# Patient Record
Sex: Female | Born: 1935 | Race: White | Hispanic: No | Smoking: Never smoker
Health system: Southern US, Community
[De-identification: ages and names within clinical notes are randomized; demographics above are authoritative.]

## PROBLEM LIST (undated history)

## (undated) DIAGNOSIS — M1990 Unspecified osteoarthritis, unspecified site: Secondary | ICD-10-CM

## (undated) DIAGNOSIS — I10 Essential (primary) hypertension: Secondary | ICD-10-CM

## (undated) DIAGNOSIS — E039 Hypothyroidism, unspecified: Secondary | ICD-10-CM

## (undated) DIAGNOSIS — C50919 Malignant neoplasm of unspecified site of unspecified female breast: Secondary | ICD-10-CM

## (undated) HISTORY — PX: ABDOMINAL HYSTERECTOMY: SHX81

## (undated) HISTORY — PX: BREAST SURGERY: SHX581

## (undated) HISTORY — PX: KNEE ARTHROPLASTY: SHX992

## (undated) HISTORY — PX: REPLACEMENT TOTAL KNEE: SUR1224

## (undated) HISTORY — PX: RECTOCELE REPAIR: SHX761

## (undated) HISTORY — DX: Essential (primary) hypertension: I10

## (undated) HISTORY — DX: Malignant neoplasm of unspecified site of unspecified female breast: C50.919

## (undated) HISTORY — PX: CHOLECYSTECTOMY: SHX55

## (undated) HISTORY — PX: CYSTOCELE REPAIR: SHX163

## (undated) HISTORY — PX: TUBAL LIGATION: SHX77

## (undated) HISTORY — DX: Hypothyroidism, unspecified: E03.9

## (undated) HISTORY — DX: Unspecified osteoarthritis, unspecified site: M19.90

---

## 2004-10-23 ENCOUNTER — Encounter: Admission: RE | Admit: 2004-10-23 | Discharge: 2004-10-23 | Payer: Self-pay | Admitting: Internal Medicine

## 2005-01-30 ENCOUNTER — Encounter: Admission: RE | Admit: 2005-01-30 | Discharge: 2005-01-30 | Payer: Self-pay | Admitting: Internal Medicine

## 2005-02-07 ENCOUNTER — Encounter: Admission: RE | Admit: 2005-02-07 | Discharge: 2005-02-07 | Payer: Self-pay | Admitting: Internal Medicine

## 2005-02-28 ENCOUNTER — Encounter: Admission: RE | Admit: 2005-02-28 | Discharge: 2005-02-28 | Payer: Self-pay | Admitting: Internal Medicine

## 2005-07-30 ENCOUNTER — Encounter: Admission: RE | Admit: 2005-07-30 | Discharge: 2005-07-30 | Payer: Self-pay | Admitting: Internal Medicine

## 2006-06-11 IMAGING — US US MFM FOLLOW-UP FOCUS VISIT
1 series · 13 of 23 positions shown · non-contrast
Comparison: none

CLINICAL DATA: one month follow-up visit after combination sclerotherapy and transcatheter laser occlusion of the left GSV performed on 01/30/05.
ULTRASOUND ESTABLISHED PATIENT OFFICE VISIT ? LEVEL II ? [id number]:
Today, Ms. Witmer returns for a one month follow-up visit after combination sclerotherapy and transcatheter laser occlusion of the left GSV performed on 01/30/05.
Since treatment, she has recovered well over the last month.  She has improvement in the mild thrombophlebitis along the treated segment.  She tolerates wearing the graded compression stockings and continues a regular exercise program.  
On exam, the bruising along the treated segment has resolved.  There is obvious palpable thrombus in the superficial varicosities which are nontender.  The entry sites have healed.  Overlying skin intact.  No erythema, swelling or fluctuance.
Ultrasound was performed today demonstrating complete occlusion of a treated GSV segment.  The deep venous system is normal.

[Series 1: unknown · 13 of 23 slices shown]
[im 1/23]
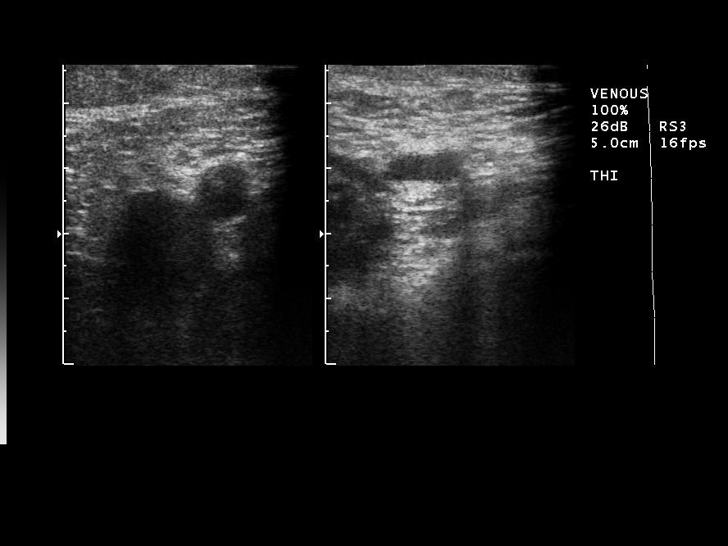
[im 3/23]
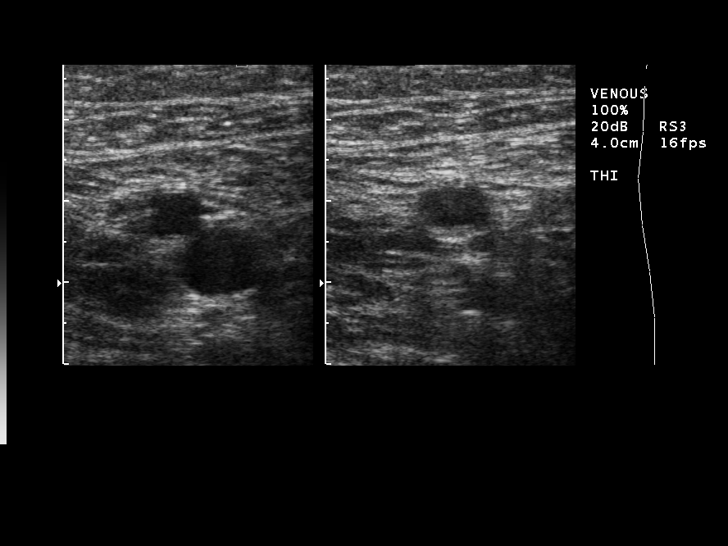
[im 5/23]
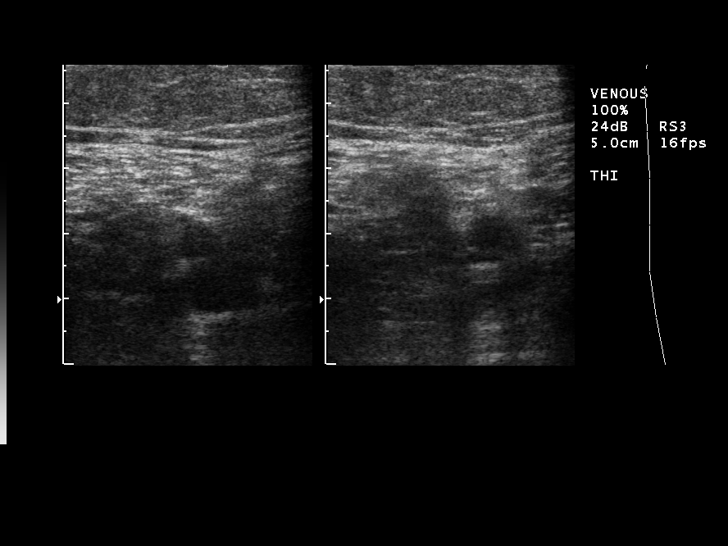
[im 7/23]
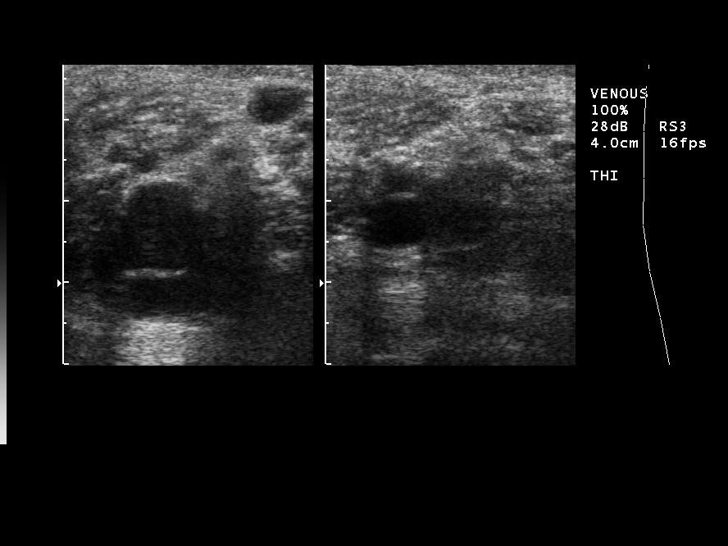
[im 8/23]
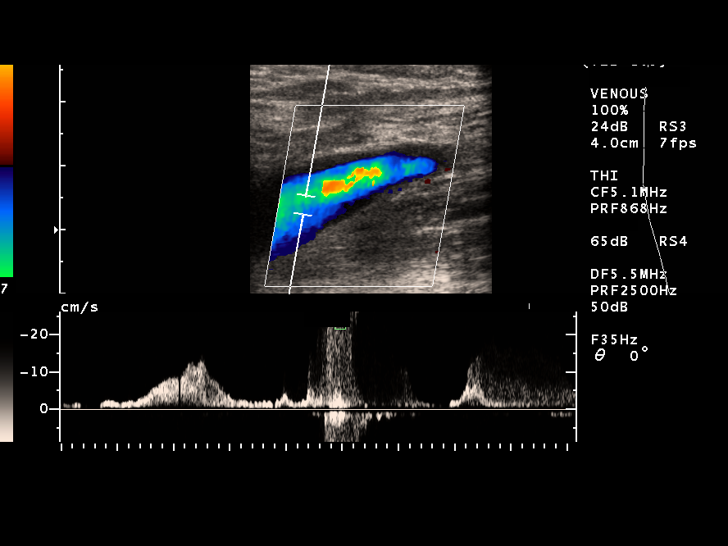
[im 10/23]
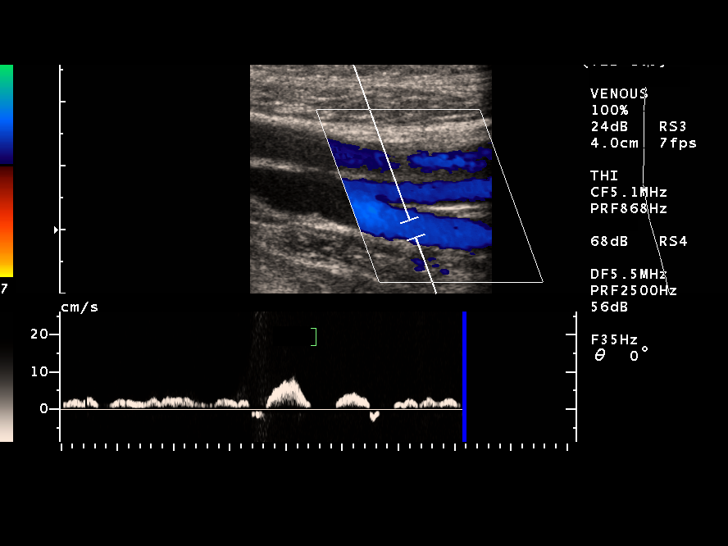
[im 12/23]
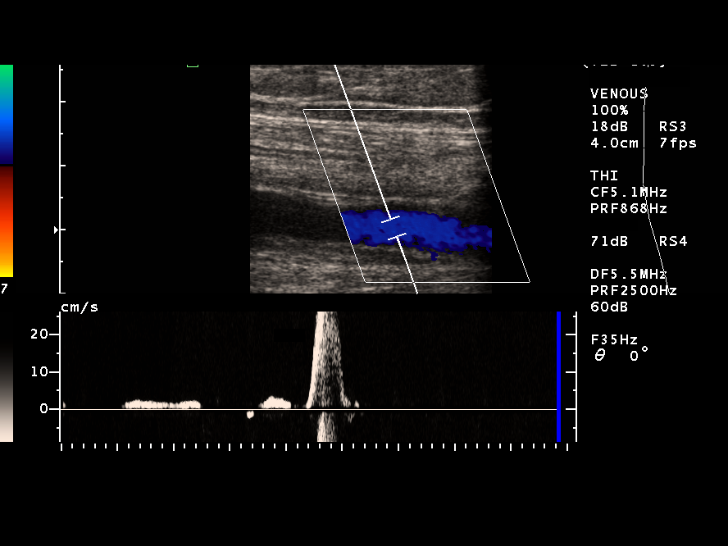
[im 14/23]
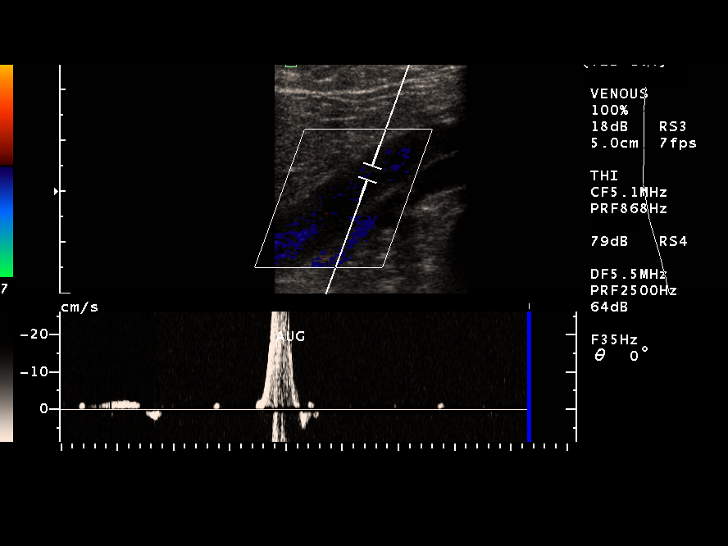
[im 16/23]
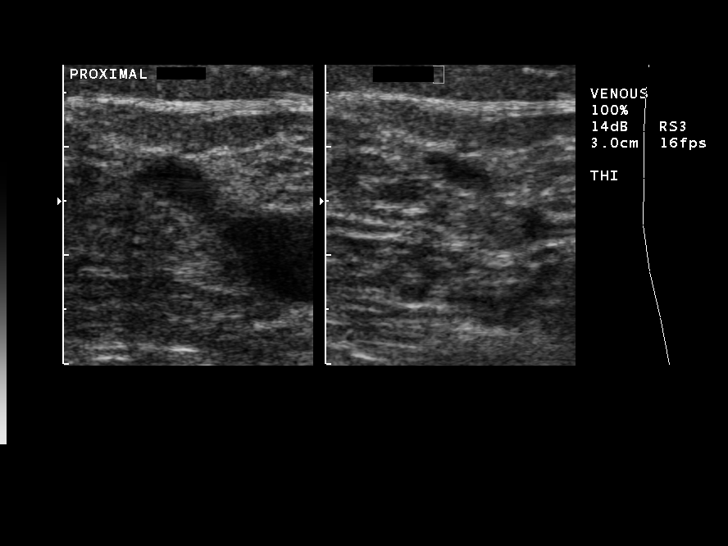
[im 17/23]
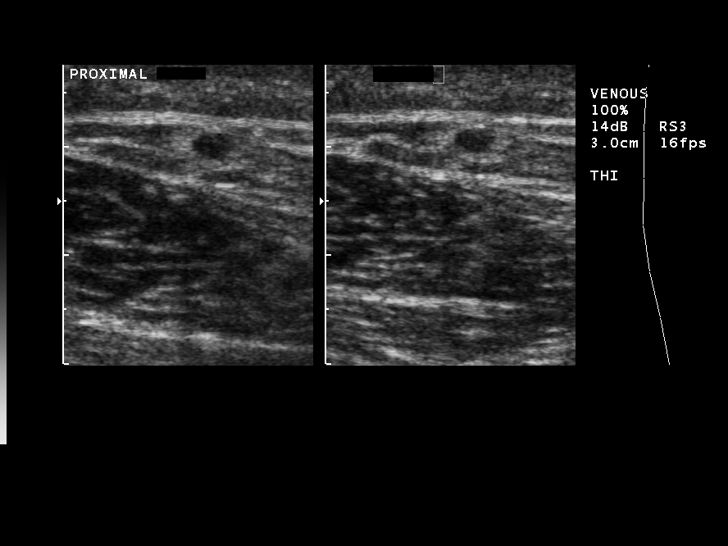
[im 19/23]
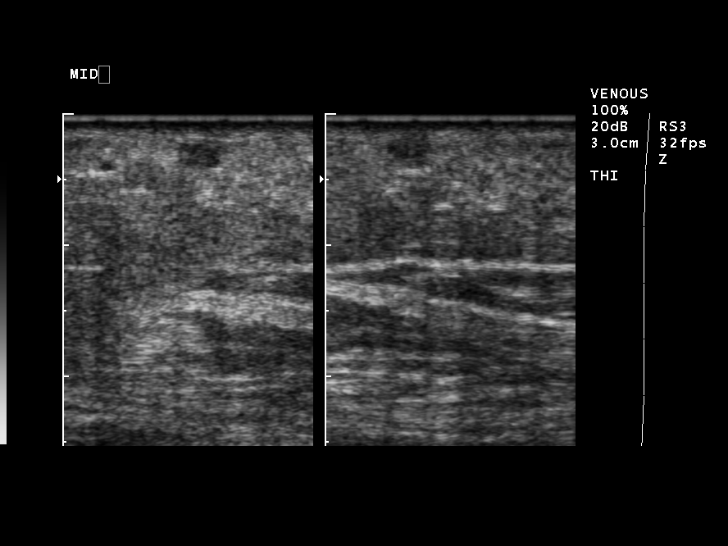
[im 21/23]
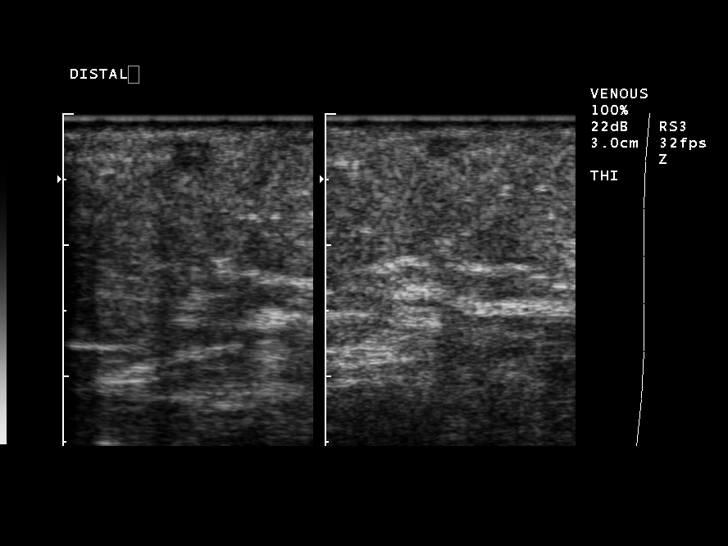
[im 23/23]
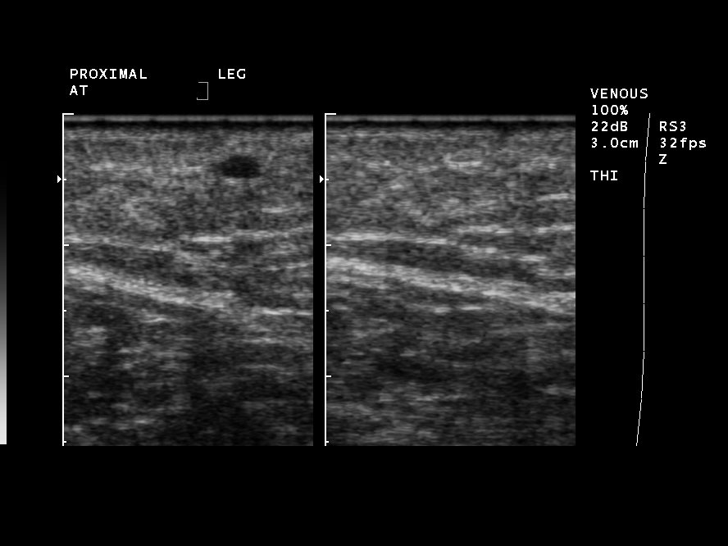

[13 of 23 positions shown; findings below may reference images not displayed]

IMPRESSION: One month status post combination sclerotherapy and transcatheter laser occlusion of the left GSV.   She continues to do very well and was encouraged to continue wearing the graded compression stockings and her exercise regimen as much as possible.  The thrombosed superficial varicosities will resorb slowly over time.  She is scheduled for six month follow-up visit.

## 2011-03-25 DIAGNOSIS — E785 Hyperlipidemia, unspecified: Secondary | ICD-10-CM | POA: Diagnosis not present

## 2011-04-03 DIAGNOSIS — L578 Other skin changes due to chronic exposure to nonionizing radiation: Secondary | ICD-10-CM | POA: Diagnosis not present

## 2011-04-03 DIAGNOSIS — L570 Actinic keratosis: Secondary | ICD-10-CM | POA: Diagnosis not present

## 2011-04-03 DIAGNOSIS — L821 Other seborrheic keratosis: Secondary | ICD-10-CM | POA: Diagnosis not present

## 2011-04-26 DIAGNOSIS — Z1231 Encounter for screening mammogram for malignant neoplasm of breast: Secondary | ICD-10-CM | POA: Diagnosis not present

## 2011-05-17 DIAGNOSIS — H2589 Other age-related cataract: Secondary | ICD-10-CM | POA: Diagnosis not present

## 2011-07-03 DIAGNOSIS — E785 Hyperlipidemia, unspecified: Secondary | ICD-10-CM | POA: Diagnosis not present

## 2011-07-03 DIAGNOSIS — E039 Hypothyroidism, unspecified: Secondary | ICD-10-CM | POA: Diagnosis not present

## 2011-07-03 DIAGNOSIS — I10 Essential (primary) hypertension: Secondary | ICD-10-CM | POA: Diagnosis not present

## 2011-07-03 DIAGNOSIS — M899 Disorder of bone, unspecified: Secondary | ICD-10-CM | POA: Diagnosis not present

## 2011-07-03 DIAGNOSIS — M949 Disorder of cartilage, unspecified: Secondary | ICD-10-CM | POA: Diagnosis not present

## 2011-08-14 DIAGNOSIS — M810 Age-related osteoporosis without current pathological fracture: Secondary | ICD-10-CM | POA: Diagnosis not present

## 2011-08-27 DIAGNOSIS — C44529 Squamous cell carcinoma of skin of other part of trunk: Secondary | ICD-10-CM | POA: Diagnosis not present

## 2011-08-27 DIAGNOSIS — L821 Other seborrheic keratosis: Secondary | ICD-10-CM | POA: Diagnosis not present

## 2011-08-27 DIAGNOSIS — L570 Actinic keratosis: Secondary | ICD-10-CM | POA: Diagnosis not present

## 2011-09-10 DIAGNOSIS — C44529 Squamous cell carcinoma of skin of other part of trunk: Secondary | ICD-10-CM | POA: Diagnosis not present

## 2011-09-26 DIAGNOSIS — M1710 Unilateral primary osteoarthritis, unspecified knee: Secondary | ICD-10-CM | POA: Diagnosis not present

## 2011-09-26 DIAGNOSIS — M19049 Primary osteoarthritis, unspecified hand: Secondary | ICD-10-CM | POA: Diagnosis not present

## 2011-10-03 DIAGNOSIS — M899 Disorder of bone, unspecified: Secondary | ICD-10-CM | POA: Diagnosis not present

## 2011-10-03 DIAGNOSIS — I10 Essential (primary) hypertension: Secondary | ICD-10-CM | POA: Diagnosis not present

## 2011-10-17 DIAGNOSIS — M1710 Unilateral primary osteoarthritis, unspecified knee: Secondary | ICD-10-CM | POA: Diagnosis not present

## 2011-10-17 DIAGNOSIS — M25569 Pain in unspecified knee: Secondary | ICD-10-CM | POA: Diagnosis not present

## 2011-11-18 DIAGNOSIS — H2589 Other age-related cataract: Secondary | ICD-10-CM | POA: Diagnosis not present

## 2011-11-20 DIAGNOSIS — Z79899 Other long term (current) drug therapy: Secondary | ICD-10-CM | POA: Diagnosis not present

## 2011-11-20 DIAGNOSIS — R5383 Other fatigue: Secondary | ICD-10-CM | POA: Diagnosis not present

## 2011-11-20 DIAGNOSIS — M2550 Pain in unspecified joint: Secondary | ICD-10-CM | POA: Diagnosis not present

## 2011-11-20 DIAGNOSIS — M25549 Pain in joints of unspecified hand: Secondary | ICD-10-CM | POA: Diagnosis not present

## 2011-11-20 DIAGNOSIS — M25579 Pain in unspecified ankle and joints of unspecified foot: Secondary | ICD-10-CM | POA: Diagnosis not present

## 2011-11-20 DIAGNOSIS — M25569 Pain in unspecified knee: Secondary | ICD-10-CM | POA: Diagnosis not present

## 2011-11-20 DIAGNOSIS — R5381 Other malaise: Secondary | ICD-10-CM | POA: Diagnosis not present

## 2011-11-28 DIAGNOSIS — M1710 Unilateral primary osteoarthritis, unspecified knee: Secondary | ICD-10-CM | POA: Diagnosis not present

## 2011-12-04 DIAGNOSIS — Z23 Encounter for immunization: Secondary | ICD-10-CM | POA: Diagnosis not present

## 2011-12-19 DIAGNOSIS — M25569 Pain in unspecified knee: Secondary | ICD-10-CM | POA: Diagnosis not present

## 2011-12-19 DIAGNOSIS — M25549 Pain in joints of unspecified hand: Secondary | ICD-10-CM | POA: Diagnosis not present

## 2011-12-19 DIAGNOSIS — M25579 Pain in unspecified ankle and joints of unspecified foot: Secondary | ICD-10-CM | POA: Diagnosis not present

## 2011-12-30 DIAGNOSIS — L219 Seborrheic dermatitis, unspecified: Secondary | ICD-10-CM | POA: Diagnosis not present

## 2011-12-30 DIAGNOSIS — L570 Actinic keratosis: Secondary | ICD-10-CM | POA: Diagnosis not present

## 2011-12-30 DIAGNOSIS — L821 Other seborrheic keratosis: Secondary | ICD-10-CM | POA: Diagnosis not present

## 2011-12-30 DIAGNOSIS — L820 Inflamed seborrheic keratosis: Secondary | ICD-10-CM | POA: Diagnosis not present

## 2012-01-06 DIAGNOSIS — R1310 Dysphagia, unspecified: Secondary | ICD-10-CM | POA: Diagnosis not present

## 2012-01-06 DIAGNOSIS — I10 Essential (primary) hypertension: Secondary | ICD-10-CM | POA: Diagnosis not present

## 2012-01-14 DIAGNOSIS — H2589 Other age-related cataract: Secondary | ICD-10-CM | POA: Diagnosis not present

## 2012-01-21 DIAGNOSIS — H259 Unspecified age-related cataract: Secondary | ICD-10-CM | POA: Diagnosis not present

## 2012-01-21 DIAGNOSIS — H269 Unspecified cataract: Secondary | ICD-10-CM | POA: Diagnosis not present

## 2012-01-21 DIAGNOSIS — H2589 Other age-related cataract: Secondary | ICD-10-CM | POA: Diagnosis not present

## 2012-02-04 DIAGNOSIS — H259 Unspecified age-related cataract: Secondary | ICD-10-CM | POA: Diagnosis not present

## 2012-02-04 DIAGNOSIS — H269 Unspecified cataract: Secondary | ICD-10-CM | POA: Diagnosis not present

## 2012-02-04 DIAGNOSIS — H2589 Other age-related cataract: Secondary | ICD-10-CM | POA: Diagnosis not present

## 2012-03-09 DIAGNOSIS — H524 Presbyopia: Secondary | ICD-10-CM | POA: Diagnosis not present

## 2012-03-09 DIAGNOSIS — Z09 Encounter for follow-up examination after completed treatment for conditions other than malignant neoplasm: Secondary | ICD-10-CM | POA: Diagnosis not present

## 2012-03-25 DIAGNOSIS — M19049 Primary osteoarthritis, unspecified hand: Secondary | ICD-10-CM | POA: Diagnosis not present

## 2012-03-25 DIAGNOSIS — M19079 Primary osteoarthritis, unspecified ankle and foot: Secondary | ICD-10-CM | POA: Diagnosis not present

## 2012-03-25 DIAGNOSIS — M1710 Unilateral primary osteoarthritis, unspecified knee: Secondary | ICD-10-CM | POA: Diagnosis not present

## 2012-03-25 DIAGNOSIS — Z79899 Other long term (current) drug therapy: Secondary | ICD-10-CM | POA: Diagnosis not present

## 2012-03-25 DIAGNOSIS — M25549 Pain in joints of unspecified hand: Secondary | ICD-10-CM | POA: Diagnosis not present

## 2012-04-13 DIAGNOSIS — M069 Rheumatoid arthritis, unspecified: Secondary | ICD-10-CM | POA: Diagnosis not present

## 2012-04-13 DIAGNOSIS — I10 Essential (primary) hypertension: Secondary | ICD-10-CM | POA: Diagnosis not present

## 2012-04-13 DIAGNOSIS — R1310 Dysphagia, unspecified: Secondary | ICD-10-CM | POA: Diagnosis not present

## 2012-04-27 DIAGNOSIS — Z1231 Encounter for screening mammogram for malignant neoplasm of breast: Secondary | ICD-10-CM | POA: Diagnosis not present

## 2012-04-28 DIAGNOSIS — IMO0002 Reserved for concepts with insufficient information to code with codable children: Secondary | ICD-10-CM | POA: Diagnosis not present

## 2012-05-13 DIAGNOSIS — Z79899 Other long term (current) drug therapy: Secondary | ICD-10-CM | POA: Diagnosis not present

## 2012-06-29 DIAGNOSIS — L578 Other skin changes due to chronic exposure to nonionizing radiation: Secondary | ICD-10-CM | POA: Diagnosis not present

## 2012-06-29 DIAGNOSIS — L821 Other seborrheic keratosis: Secondary | ICD-10-CM | POA: Diagnosis not present

## 2012-06-29 DIAGNOSIS — L570 Actinic keratosis: Secondary | ICD-10-CM | POA: Diagnosis not present

## 2012-07-08 DIAGNOSIS — M1710 Unilateral primary osteoarthritis, unspecified knee: Secondary | ICD-10-CM | POA: Diagnosis not present

## 2012-07-08 DIAGNOSIS — M25549 Pain in joints of unspecified hand: Secondary | ICD-10-CM | POA: Diagnosis not present

## 2012-07-08 DIAGNOSIS — M810 Age-related osteoporosis without current pathological fracture: Secondary | ICD-10-CM | POA: Diagnosis not present

## 2012-07-08 DIAGNOSIS — R5383 Other fatigue: Secondary | ICD-10-CM | POA: Diagnosis not present

## 2012-07-08 DIAGNOSIS — M25519 Pain in unspecified shoulder: Secondary | ICD-10-CM | POA: Diagnosis not present

## 2012-07-13 DIAGNOSIS — J309 Allergic rhinitis, unspecified: Secondary | ICD-10-CM | POA: Diagnosis not present

## 2012-07-13 DIAGNOSIS — I10 Essential (primary) hypertension: Secondary | ICD-10-CM | POA: Diagnosis not present

## 2012-09-07 DIAGNOSIS — Z79899 Other long term (current) drug therapy: Secondary | ICD-10-CM | POA: Diagnosis not present

## 2012-09-28 DIAGNOSIS — M1710 Unilateral primary osteoarthritis, unspecified knee: Secondary | ICD-10-CM | POA: Diagnosis not present

## 2012-10-06 DIAGNOSIS — M1710 Unilateral primary osteoarthritis, unspecified knee: Secondary | ICD-10-CM | POA: Diagnosis not present

## 2012-10-14 DIAGNOSIS — E039 Hypothyroidism, unspecified: Secondary | ICD-10-CM | POA: Diagnosis not present

## 2012-10-14 DIAGNOSIS — I10 Essential (primary) hypertension: Secondary | ICD-10-CM | POA: Diagnosis not present

## 2012-10-14 DIAGNOSIS — M899 Disorder of bone, unspecified: Secondary | ICD-10-CM | POA: Diagnosis not present

## 2012-10-14 DIAGNOSIS — M129 Arthropathy, unspecified: Secondary | ICD-10-CM | POA: Diagnosis not present

## 2012-10-14 DIAGNOSIS — E785 Hyperlipidemia, unspecified: Secondary | ICD-10-CM | POA: Diagnosis not present

## 2012-10-15 DIAGNOSIS — M1710 Unilateral primary osteoarthritis, unspecified knee: Secondary | ICD-10-CM | POA: Diagnosis not present

## 2012-10-20 DIAGNOSIS — M1710 Unilateral primary osteoarthritis, unspecified knee: Secondary | ICD-10-CM | POA: Diagnosis not present

## 2012-11-04 DIAGNOSIS — M19079 Primary osteoarthritis, unspecified ankle and foot: Secondary | ICD-10-CM | POA: Diagnosis not present

## 2012-11-04 DIAGNOSIS — M19049 Primary osteoarthritis, unspecified hand: Secondary | ICD-10-CM | POA: Diagnosis not present

## 2012-11-04 DIAGNOSIS — M1710 Unilateral primary osteoarthritis, unspecified knee: Secondary | ICD-10-CM | POA: Diagnosis not present

## 2012-11-24 DIAGNOSIS — J018 Other acute sinusitis: Secondary | ICD-10-CM | POA: Diagnosis not present

## 2012-12-14 DIAGNOSIS — J309 Allergic rhinitis, unspecified: Secondary | ICD-10-CM | POA: Diagnosis not present

## 2012-12-14 DIAGNOSIS — R059 Cough, unspecified: Secondary | ICD-10-CM | POA: Diagnosis not present

## 2012-12-14 DIAGNOSIS — R05 Cough: Secondary | ICD-10-CM | POA: Diagnosis not present

## 2012-12-29 DIAGNOSIS — L821 Other seborrheic keratosis: Secondary | ICD-10-CM | POA: Diagnosis not present

## 2012-12-29 DIAGNOSIS — L820 Inflamed seborrheic keratosis: Secondary | ICD-10-CM | POA: Diagnosis not present

## 2012-12-29 DIAGNOSIS — L570 Actinic keratosis: Secondary | ICD-10-CM | POA: Diagnosis not present

## 2013-01-08 DIAGNOSIS — Z23 Encounter for immunization: Secondary | ICD-10-CM | POA: Diagnosis not present

## 2013-01-15 DIAGNOSIS — I10 Essential (primary) hypertension: Secondary | ICD-10-CM | POA: Diagnosis not present

## 2013-03-31 DIAGNOSIS — Z79899 Other long term (current) drug therapy: Secondary | ICD-10-CM | POA: Diagnosis not present

## 2013-04-06 DIAGNOSIS — Z09 Encounter for follow-up examination after completed treatment for conditions other than malignant neoplasm: Secondary | ICD-10-CM | POA: Diagnosis not present

## 2013-04-06 DIAGNOSIS — M542 Cervicalgia: Secondary | ICD-10-CM | POA: Diagnosis not present

## 2013-04-06 DIAGNOSIS — M25569 Pain in unspecified knee: Secondary | ICD-10-CM | POA: Diagnosis not present

## 2013-04-06 DIAGNOSIS — M069 Rheumatoid arthritis, unspecified: Secondary | ICD-10-CM | POA: Diagnosis not present

## 2013-06-02 DIAGNOSIS — M1710 Unilateral primary osteoarthritis, unspecified knee: Secondary | ICD-10-CM | POA: Diagnosis not present

## 2013-06-02 DIAGNOSIS — IMO0002 Reserved for concepts with insufficient information to code with codable children: Secondary | ICD-10-CM | POA: Diagnosis not present

## 2013-06-08 DIAGNOSIS — R0602 Shortness of breath: Secondary | ICD-10-CM | POA: Diagnosis not present

## 2013-06-08 DIAGNOSIS — Z1231 Encounter for screening mammogram for malignant neoplasm of breast: Secondary | ICD-10-CM | POA: Diagnosis not present

## 2013-06-08 DIAGNOSIS — I10 Essential (primary) hypertension: Secondary | ICD-10-CM | POA: Diagnosis not present

## 2013-06-14 DIAGNOSIS — M25569 Pain in unspecified knee: Secondary | ICD-10-CM | POA: Diagnosis not present

## 2013-06-14 DIAGNOSIS — M1710 Unilateral primary osteoarthritis, unspecified knee: Secondary | ICD-10-CM | POA: Diagnosis not present

## 2013-06-14 DIAGNOSIS — M1990 Unspecified osteoarthritis, unspecified site: Secondary | ICD-10-CM | POA: Diagnosis not present

## 2013-06-21 DIAGNOSIS — M1710 Unilateral primary osteoarthritis, unspecified knee: Secondary | ICD-10-CM | POA: Diagnosis not present

## 2013-06-22 DIAGNOSIS — N63 Unspecified lump in unspecified breast: Secondary | ICD-10-CM | POA: Diagnosis not present

## 2013-06-22 DIAGNOSIS — R928 Other abnormal and inconclusive findings on diagnostic imaging of breast: Secondary | ICD-10-CM | POA: Diagnosis not present

## 2013-06-28 DIAGNOSIS — M1990 Unspecified osteoarthritis, unspecified site: Secondary | ICD-10-CM | POA: Diagnosis not present

## 2013-06-28 DIAGNOSIS — M1710 Unilateral primary osteoarthritis, unspecified knee: Secondary | ICD-10-CM | POA: Diagnosis not present

## 2013-07-01 DIAGNOSIS — N63 Unspecified lump in unspecified breast: Secondary | ICD-10-CM | POA: Diagnosis not present

## 2013-07-01 DIAGNOSIS — C50919 Malignant neoplasm of unspecified site of unspecified female breast: Secondary | ICD-10-CM | POA: Diagnosis not present

## 2013-07-02 DIAGNOSIS — R920 Mammographic microcalcification found on diagnostic imaging of breast: Secondary | ICD-10-CM | POA: Diagnosis not present

## 2013-07-06 DIAGNOSIS — L821 Other seborrheic keratosis: Secondary | ICD-10-CM | POA: Diagnosis not present

## 2013-07-06 DIAGNOSIS — L578 Other skin changes due to chronic exposure to nonionizing radiation: Secondary | ICD-10-CM | POA: Diagnosis not present

## 2013-07-06 DIAGNOSIS — L570 Actinic keratosis: Secondary | ICD-10-CM | POA: Diagnosis not present

## 2013-07-08 DIAGNOSIS — R920 Mammographic microcalcification found on diagnostic imaging of breast: Secondary | ICD-10-CM | POA: Diagnosis not present

## 2013-07-08 DIAGNOSIS — N63 Unspecified lump in unspecified breast: Secondary | ICD-10-CM | POA: Diagnosis not present

## 2013-07-08 DIAGNOSIS — C50919 Malignant neoplasm of unspecified site of unspecified female breast: Secondary | ICD-10-CM | POA: Diagnosis not present

## 2013-07-16 DIAGNOSIS — C50919 Malignant neoplasm of unspecified site of unspecified female breast: Secondary | ICD-10-CM | POA: Diagnosis not present

## 2013-07-16 DIAGNOSIS — E039 Hypothyroidism, unspecified: Secondary | ICD-10-CM | POA: Diagnosis not present

## 2013-07-16 DIAGNOSIS — Z79899 Other long term (current) drug therapy: Secondary | ICD-10-CM | POA: Diagnosis not present

## 2013-07-16 DIAGNOSIS — I10 Essential (primary) hypertension: Secondary | ICD-10-CM | POA: Diagnosis not present

## 2013-07-16 DIAGNOSIS — K219 Gastro-esophageal reflux disease without esophagitis: Secondary | ICD-10-CM | POA: Diagnosis not present

## 2013-07-30 DIAGNOSIS — M899 Disorder of bone, unspecified: Secondary | ICD-10-CM | POA: Diagnosis not present

## 2013-07-30 DIAGNOSIS — Z170 Estrogen receptor positive status [ER+]: Secondary | ICD-10-CM | POA: Diagnosis not present

## 2013-07-30 DIAGNOSIS — Z79899 Other long term (current) drug therapy: Secondary | ICD-10-CM | POA: Diagnosis not present

## 2013-07-30 DIAGNOSIS — E785 Hyperlipidemia, unspecified: Secondary | ICD-10-CM | POA: Diagnosis not present

## 2013-07-30 DIAGNOSIS — E039 Hypothyroidism, unspecified: Secondary | ICD-10-CM | POA: Diagnosis not present

## 2013-07-30 DIAGNOSIS — C50919 Malignant neoplasm of unspecified site of unspecified female breast: Secondary | ICD-10-CM | POA: Diagnosis not present

## 2013-07-30 DIAGNOSIS — I10 Essential (primary) hypertension: Secondary | ICD-10-CM | POA: Diagnosis not present

## 2013-07-30 DIAGNOSIS — M949 Disorder of cartilage, unspecified: Secondary | ICD-10-CM | POA: Diagnosis not present

## 2013-07-30 DIAGNOSIS — M19049 Primary osteoarthritis, unspecified hand: Secondary | ICD-10-CM | POA: Diagnosis not present

## 2013-08-30 DIAGNOSIS — Z79899 Other long term (current) drug therapy: Secondary | ICD-10-CM | POA: Diagnosis not present

## 2013-09-08 DIAGNOSIS — H26499 Other secondary cataract, unspecified eye: Secondary | ICD-10-CM | POA: Diagnosis not present

## 2013-09-08 DIAGNOSIS — Z79899 Other long term (current) drug therapy: Secondary | ICD-10-CM | POA: Diagnosis not present

## 2013-09-09 DIAGNOSIS — E785 Hyperlipidemia, unspecified: Secondary | ICD-10-CM | POA: Diagnosis not present

## 2013-09-09 DIAGNOSIS — C50919 Malignant neoplasm of unspecified site of unspecified female breast: Secondary | ICD-10-CM | POA: Diagnosis not present

## 2013-09-09 DIAGNOSIS — I10 Essential (primary) hypertension: Secondary | ICD-10-CM | POA: Diagnosis not present

## 2013-10-04 DIAGNOSIS — Z09 Encounter for follow-up examination after completed treatment for conditions other than malignant neoplasm: Secondary | ICD-10-CM | POA: Diagnosis not present

## 2013-10-04 DIAGNOSIS — M19049 Primary osteoarthritis, unspecified hand: Secondary | ICD-10-CM | POA: Diagnosis not present

## 2013-10-04 DIAGNOSIS — M1710 Unilateral primary osteoarthritis, unspecified knee: Secondary | ICD-10-CM | POA: Diagnosis not present

## 2013-10-04 DIAGNOSIS — M069 Rheumatoid arthritis, unspecified: Secondary | ICD-10-CM | POA: Diagnosis not present

## 2013-11-01 DIAGNOSIS — M949 Disorder of cartilage, unspecified: Secondary | ICD-10-CM | POA: Diagnosis not present

## 2013-11-01 DIAGNOSIS — C50919 Malignant neoplasm of unspecified site of unspecified female breast: Secondary | ICD-10-CM | POA: Diagnosis not present

## 2013-11-01 DIAGNOSIS — I10 Essential (primary) hypertension: Secondary | ICD-10-CM | POA: Diagnosis not present

## 2013-11-01 DIAGNOSIS — M899 Disorder of bone, unspecified: Secondary | ICD-10-CM | POA: Diagnosis not present

## 2014-01-03 DIAGNOSIS — Z23 Encounter for immunization: Secondary | ICD-10-CM | POA: Diagnosis not present

## 2014-01-13 DIAGNOSIS — L570 Actinic keratosis: Secondary | ICD-10-CM | POA: Diagnosis not present

## 2014-01-13 DIAGNOSIS — L821 Other seborrheic keratosis: Secondary | ICD-10-CM | POA: Diagnosis not present

## 2014-02-02 DIAGNOSIS — M069 Rheumatoid arthritis, unspecified: Secondary | ICD-10-CM | POA: Diagnosis not present

## 2014-02-02 DIAGNOSIS — J302 Other seasonal allergic rhinitis: Secondary | ICD-10-CM | POA: Diagnosis not present

## 2014-02-02 DIAGNOSIS — E039 Hypothyroidism, unspecified: Secondary | ICD-10-CM | POA: Diagnosis not present

## 2014-02-02 DIAGNOSIS — E785 Hyperlipidemia, unspecified: Secondary | ICD-10-CM | POA: Diagnosis not present

## 2014-02-02 DIAGNOSIS — M899 Disorder of bone, unspecified: Secondary | ICD-10-CM | POA: Diagnosis not present

## 2014-02-02 DIAGNOSIS — Z1389 Encounter for screening for other disorder: Secondary | ICD-10-CM | POA: Diagnosis not present

## 2014-02-07 DIAGNOSIS — Z853 Personal history of malignant neoplasm of breast: Secondary | ICD-10-CM | POA: Diagnosis not present

## 2014-02-07 DIAGNOSIS — M8589 Other specified disorders of bone density and structure, multiple sites: Secondary | ICD-10-CM | POA: Diagnosis not present

## 2014-02-07 DIAGNOSIS — Z170 Estrogen receptor positive status [ER+]: Secondary | ICD-10-CM | POA: Diagnosis not present

## 2014-03-15 DIAGNOSIS — M25561 Pain in right knee: Secondary | ICD-10-CM | POA: Diagnosis not present

## 2014-03-15 DIAGNOSIS — M25562 Pain in left knee: Secondary | ICD-10-CM | POA: Diagnosis not present

## 2014-03-15 DIAGNOSIS — M069 Rheumatoid arthritis, unspecified: Secondary | ICD-10-CM | POA: Diagnosis not present

## 2014-03-15 DIAGNOSIS — Z79899 Other long term (current) drug therapy: Secondary | ICD-10-CM | POA: Diagnosis not present

## 2014-03-16 DIAGNOSIS — Z79899 Other long term (current) drug therapy: Secondary | ICD-10-CM | POA: Diagnosis not present

## 2014-03-16 DIAGNOSIS — M069 Rheumatoid arthritis, unspecified: Secondary | ICD-10-CM | POA: Diagnosis not present

## 2014-03-16 DIAGNOSIS — Z23 Encounter for immunization: Secondary | ICD-10-CM | POA: Diagnosis not present

## 2014-03-16 DIAGNOSIS — H524 Presbyopia: Secondary | ICD-10-CM | POA: Diagnosis not present

## 2014-03-21 DIAGNOSIS — M8580 Other specified disorders of bone density and structure, unspecified site: Secondary | ICD-10-CM | POA: Diagnosis not present

## 2014-03-21 DIAGNOSIS — Z13820 Encounter for screening for osteoporosis: Secondary | ICD-10-CM | POA: Diagnosis not present

## 2014-03-21 DIAGNOSIS — M8589 Other specified disorders of bone density and structure, multiple sites: Secondary | ICD-10-CM | POA: Diagnosis not present

## 2014-04-27 DIAGNOSIS — I10 Essential (primary) hypertension: Secondary | ICD-10-CM | POA: Diagnosis not present

## 2014-05-19 DIAGNOSIS — Z79899 Other long term (current) drug therapy: Secondary | ICD-10-CM | POA: Diagnosis not present

## 2014-05-24 DIAGNOSIS — L821 Other seborrheic keratosis: Secondary | ICD-10-CM | POA: Diagnosis not present

## 2014-05-24 DIAGNOSIS — L570 Actinic keratosis: Secondary | ICD-10-CM | POA: Diagnosis not present

## 2014-05-25 DIAGNOSIS — Z170 Estrogen receptor positive status [ER+]: Secondary | ICD-10-CM | POA: Diagnosis not present

## 2014-05-25 DIAGNOSIS — Z853 Personal history of malignant neoplasm of breast: Secondary | ICD-10-CM | POA: Diagnosis not present

## 2014-05-25 DIAGNOSIS — M8589 Other specified disorders of bone density and structure, multiple sites: Secondary | ICD-10-CM | POA: Diagnosis not present

## 2014-06-30 DIAGNOSIS — R928 Other abnormal and inconclusive findings on diagnostic imaging of breast: Secondary | ICD-10-CM | POA: Diagnosis not present

## 2014-06-30 DIAGNOSIS — Z853 Personal history of malignant neoplasm of breast: Secondary | ICD-10-CM | POA: Diagnosis not present

## 2014-06-30 DIAGNOSIS — C50312 Malignant neoplasm of lower-inner quadrant of left female breast: Secondary | ICD-10-CM | POA: Diagnosis not present

## 2014-07-14 DIAGNOSIS — L821 Other seborrheic keratosis: Secondary | ICD-10-CM | POA: Diagnosis not present

## 2014-07-14 DIAGNOSIS — L570 Actinic keratosis: Secondary | ICD-10-CM | POA: Diagnosis not present

## 2014-07-14 DIAGNOSIS — L578 Other skin changes due to chronic exposure to nonionizing radiation: Secondary | ICD-10-CM | POA: Diagnosis not present

## 2014-07-27 DIAGNOSIS — M069 Rheumatoid arthritis, unspecified: Secondary | ICD-10-CM | POA: Diagnosis not present

## 2014-07-27 DIAGNOSIS — I10 Essential (primary) hypertension: Secondary | ICD-10-CM | POA: Diagnosis not present

## 2014-08-12 DIAGNOSIS — Z79899 Other long term (current) drug therapy: Secondary | ICD-10-CM | POA: Diagnosis not present

## 2014-08-16 DIAGNOSIS — M1711 Unilateral primary osteoarthritis, right knee: Secondary | ICD-10-CM | POA: Diagnosis not present

## 2014-08-16 DIAGNOSIS — M069 Rheumatoid arthritis, unspecified: Secondary | ICD-10-CM | POA: Diagnosis not present

## 2014-08-16 DIAGNOSIS — M19041 Primary osteoarthritis, right hand: Secondary | ICD-10-CM | POA: Diagnosis not present

## 2014-08-16 DIAGNOSIS — M19071 Primary osteoarthritis, right ankle and foot: Secondary | ICD-10-CM | POA: Diagnosis not present

## 2014-08-16 DIAGNOSIS — M8580 Other specified disorders of bone density and structure, unspecified site: Secondary | ICD-10-CM | POA: Diagnosis not present

## 2014-09-02 DIAGNOSIS — M170 Bilateral primary osteoarthritis of knee: Secondary | ICD-10-CM | POA: Diagnosis not present

## 2014-09-09 DIAGNOSIS — M170 Bilateral primary osteoarthritis of knee: Secondary | ICD-10-CM | POA: Diagnosis not present

## 2014-09-14 DIAGNOSIS — Z79899 Other long term (current) drug therapy: Secondary | ICD-10-CM | POA: Diagnosis not present

## 2014-09-14 DIAGNOSIS — M05741 Rheumatoid arthritis with rheumatoid factor of right hand without organ or systems involvement: Secondary | ICD-10-CM | POA: Diagnosis not present

## 2014-09-15 DIAGNOSIS — Z853 Personal history of malignant neoplasm of breast: Secondary | ICD-10-CM | POA: Diagnosis not present

## 2014-09-15 DIAGNOSIS — M8580 Other specified disorders of bone density and structure, unspecified site: Secondary | ICD-10-CM | POA: Diagnosis not present

## 2014-09-16 DIAGNOSIS — M170 Bilateral primary osteoarthritis of knee: Secondary | ICD-10-CM | POA: Diagnosis not present

## 2014-09-23 DIAGNOSIS — M170 Bilateral primary osteoarthritis of knee: Secondary | ICD-10-CM | POA: Diagnosis not present

## 2014-09-30 DIAGNOSIS — M170 Bilateral primary osteoarthritis of knee: Secondary | ICD-10-CM | POA: Diagnosis not present

## 2014-10-24 DIAGNOSIS — M0609 Rheumatoid arthritis without rheumatoid factor, multiple sites: Secondary | ICD-10-CM | POA: Diagnosis not present

## 2014-10-24 DIAGNOSIS — F411 Generalized anxiety disorder: Secondary | ICD-10-CM | POA: Diagnosis not present

## 2014-10-24 DIAGNOSIS — I10 Essential (primary) hypertension: Secondary | ICD-10-CM | POA: Diagnosis not present

## 2014-11-23 DIAGNOSIS — M25561 Pain in right knee: Secondary | ICD-10-CM | POA: Diagnosis not present

## 2014-11-30 DIAGNOSIS — M1711 Unilateral primary osteoarthritis, right knee: Secondary | ICD-10-CM | POA: Diagnosis not present

## 2014-11-30 DIAGNOSIS — M6281 Muscle weakness (generalized): Secondary | ICD-10-CM | POA: Diagnosis not present

## 2014-11-30 DIAGNOSIS — M25561 Pain in right knee: Secondary | ICD-10-CM | POA: Diagnosis not present

## 2014-12-04 DIAGNOSIS — Z23 Encounter for immunization: Secondary | ICD-10-CM | POA: Diagnosis not present

## 2014-12-07 DIAGNOSIS — M1711 Unilateral primary osteoarthritis, right knee: Secondary | ICD-10-CM | POA: Diagnosis not present

## 2014-12-07 DIAGNOSIS — M6281 Muscle weakness (generalized): Secondary | ICD-10-CM | POA: Diagnosis not present

## 2014-12-07 DIAGNOSIS — M25561 Pain in right knee: Secondary | ICD-10-CM | POA: Diagnosis not present

## 2014-12-09 DIAGNOSIS — M6281 Muscle weakness (generalized): Secondary | ICD-10-CM | POA: Diagnosis not present

## 2014-12-09 DIAGNOSIS — M25561 Pain in right knee: Secondary | ICD-10-CM | POA: Diagnosis not present

## 2014-12-09 DIAGNOSIS — M1711 Unilateral primary osteoarthritis, right knee: Secondary | ICD-10-CM | POA: Diagnosis not present

## 2014-12-09 DIAGNOSIS — Z79899 Other long term (current) drug therapy: Secondary | ICD-10-CM | POA: Diagnosis not present

## 2014-12-15 DIAGNOSIS — J189 Pneumonia, unspecified organism: Secondary | ICD-10-CM | POA: Diagnosis not present

## 2014-12-23 DIAGNOSIS — M170 Bilateral primary osteoarthritis of knee: Secondary | ICD-10-CM | POA: Diagnosis not present

## 2014-12-23 DIAGNOSIS — Z09 Encounter for follow-up examination after completed treatment for conditions other than malignant neoplasm: Secondary | ICD-10-CM | POA: Diagnosis not present

## 2014-12-23 DIAGNOSIS — M19041 Primary osteoarthritis, right hand: Secondary | ICD-10-CM | POA: Diagnosis not present

## 2014-12-23 DIAGNOSIS — M0609 Rheumatoid arthritis without rheumatoid factor, multiple sites: Secondary | ICD-10-CM | POA: Diagnosis not present

## 2014-12-26 DIAGNOSIS — M6281 Muscle weakness (generalized): Secondary | ICD-10-CM | POA: Diagnosis not present

## 2014-12-26 DIAGNOSIS — M25561 Pain in right knee: Secondary | ICD-10-CM | POA: Diagnosis not present

## 2014-12-26 DIAGNOSIS — M1711 Unilateral primary osteoarthritis, right knee: Secondary | ICD-10-CM | POA: Diagnosis not present

## 2014-12-28 DIAGNOSIS — Z853 Personal history of malignant neoplasm of breast: Secondary | ICD-10-CM | POA: Diagnosis not present

## 2014-12-28 DIAGNOSIS — M8589 Other specified disorders of bone density and structure, multiple sites: Secondary | ICD-10-CM | POA: Diagnosis not present

## 2014-12-28 DIAGNOSIS — C50912 Malignant neoplasm of unspecified site of left female breast: Secondary | ICD-10-CM | POA: Diagnosis not present

## 2014-12-28 DIAGNOSIS — Z79811 Long term (current) use of aromatase inhibitors: Secondary | ICD-10-CM | POA: Diagnosis not present

## 2014-12-28 DIAGNOSIS — Z79810 Long term (current) use of selective estrogen receptor modulators (SERMs): Secondary | ICD-10-CM | POA: Diagnosis not present

## 2014-12-30 DIAGNOSIS — M25561 Pain in right knee: Secondary | ICD-10-CM | POA: Diagnosis not present

## 2014-12-30 DIAGNOSIS — M1711 Unilateral primary osteoarthritis, right knee: Secondary | ICD-10-CM | POA: Diagnosis not present

## 2014-12-30 DIAGNOSIS — M6281 Muscle weakness (generalized): Secondary | ICD-10-CM | POA: Diagnosis not present

## 2015-01-02 DIAGNOSIS — M25561 Pain in right knee: Secondary | ICD-10-CM | POA: Diagnosis not present

## 2015-01-02 DIAGNOSIS — M1711 Unilateral primary osteoarthritis, right knee: Secondary | ICD-10-CM | POA: Diagnosis not present

## 2015-01-02 DIAGNOSIS — M6281 Muscle weakness (generalized): Secondary | ICD-10-CM | POA: Diagnosis not present

## 2015-01-04 DIAGNOSIS — M05441 Rheumatoid myopathy with rheumatoid arthritis of right hand: Secondary | ICD-10-CM | POA: Diagnosis not present

## 2015-01-04 DIAGNOSIS — M05442 Rheumatoid myopathy with rheumatoid arthritis of left hand: Secondary | ICD-10-CM | POA: Diagnosis not present

## 2015-01-04 DIAGNOSIS — M79642 Pain in left hand: Secondary | ICD-10-CM | POA: Diagnosis not present

## 2015-01-04 DIAGNOSIS — M79641 Pain in right hand: Secondary | ICD-10-CM | POA: Diagnosis not present

## 2015-01-04 DIAGNOSIS — M1711 Unilateral primary osteoarthritis, right knee: Secondary | ICD-10-CM | POA: Diagnosis not present

## 2015-01-16 DIAGNOSIS — L821 Other seborrheic keratosis: Secondary | ICD-10-CM | POA: Diagnosis not present

## 2015-01-16 DIAGNOSIS — L578 Other skin changes due to chronic exposure to nonionizing radiation: Secondary | ICD-10-CM | POA: Diagnosis not present

## 2015-01-16 DIAGNOSIS — L570 Actinic keratosis: Secondary | ICD-10-CM | POA: Diagnosis not present

## 2015-01-20 DIAGNOSIS — N183 Chronic kidney disease, stage 3 (moderate): Secondary | ICD-10-CM | POA: Diagnosis not present

## 2015-01-20 DIAGNOSIS — I10 Essential (primary) hypertension: Secondary | ICD-10-CM | POA: Diagnosis not present

## 2015-01-20 DIAGNOSIS — M0609 Rheumatoid arthritis without rheumatoid factor, multiple sites: Secondary | ICD-10-CM | POA: Diagnosis not present

## 2015-01-20 DIAGNOSIS — Z1389 Encounter for screening for other disorder: Secondary | ICD-10-CM | POA: Diagnosis not present

## 2015-01-20 DIAGNOSIS — M8580 Other specified disorders of bone density and structure, unspecified site: Secondary | ICD-10-CM | POA: Diagnosis not present

## 2015-01-20 DIAGNOSIS — K219 Gastro-esophageal reflux disease without esophagitis: Secondary | ICD-10-CM | POA: Diagnosis not present

## 2015-01-20 DIAGNOSIS — F330 Major depressive disorder, recurrent, mild: Secondary | ICD-10-CM | POA: Diagnosis not present

## 2015-01-20 DIAGNOSIS — Z853 Personal history of malignant neoplasm of breast: Secondary | ICD-10-CM | POA: Diagnosis not present

## 2015-01-20 DIAGNOSIS — E039 Hypothyroidism, unspecified: Secondary | ICD-10-CM | POA: Diagnosis not present

## 2015-01-20 DIAGNOSIS — E7800 Pure hypercholesterolemia, unspecified: Secondary | ICD-10-CM | POA: Diagnosis not present

## 2015-01-20 DIAGNOSIS — M150 Primary generalized (osteo)arthritis: Secondary | ICD-10-CM | POA: Diagnosis not present

## 2015-01-20 DIAGNOSIS — M859 Disorder of bone density and structure, unspecified: Secondary | ICD-10-CM | POA: Diagnosis not present

## 2015-01-20 DIAGNOSIS — F411 Generalized anxiety disorder: Secondary | ICD-10-CM | POA: Diagnosis not present

## 2015-02-07 DIAGNOSIS — S20219A Contusion of unspecified front wall of thorax, initial encounter: Secondary | ICD-10-CM | POA: Diagnosis not present

## 2015-03-23 DIAGNOSIS — M1711 Unilateral primary osteoarthritis, right knee: Secondary | ICD-10-CM | POA: Diagnosis not present

## 2015-03-27 DIAGNOSIS — M069 Rheumatoid arthritis, unspecified: Secondary | ICD-10-CM | POA: Diagnosis not present

## 2015-03-27 DIAGNOSIS — Z79899 Other long term (current) drug therapy: Secondary | ICD-10-CM | POA: Diagnosis not present

## 2015-03-31 DIAGNOSIS — Z79810 Long term (current) use of selective estrogen receptor modulators (SERMs): Secondary | ICD-10-CM | POA: Diagnosis not present

## 2015-03-31 DIAGNOSIS — Z803 Family history of malignant neoplasm of breast: Secondary | ICD-10-CM | POA: Diagnosis not present

## 2015-03-31 DIAGNOSIS — Z800 Family history of malignant neoplasm of digestive organs: Secondary | ICD-10-CM | POA: Diagnosis not present

## 2015-03-31 DIAGNOSIS — Z808 Family history of malignant neoplasm of other organs or systems: Secondary | ICD-10-CM | POA: Diagnosis not present

## 2015-03-31 DIAGNOSIS — Z853 Personal history of malignant neoplasm of breast: Secondary | ICD-10-CM | POA: Diagnosis not present

## 2015-04-25 DIAGNOSIS — Z79899 Other long term (current) drug therapy: Secondary | ICD-10-CM | POA: Diagnosis not present

## 2015-04-28 DIAGNOSIS — I10 Essential (primary) hypertension: Secondary | ICD-10-CM | POA: Diagnosis not present

## 2015-05-01 DIAGNOSIS — C50312 Malignant neoplasm of lower-inner quadrant of left female breast: Secondary | ICD-10-CM | POA: Diagnosis not present

## 2015-05-01 DIAGNOSIS — M8589 Other specified disorders of bone density and structure, multiple sites: Secondary | ICD-10-CM | POA: Diagnosis not present

## 2015-05-03 DIAGNOSIS — Z01818 Encounter for other preprocedural examination: Secondary | ICD-10-CM | POA: Diagnosis not present

## 2015-05-03 DIAGNOSIS — Z79899 Other long term (current) drug therapy: Secondary | ICD-10-CM | POA: Diagnosis not present

## 2015-05-03 DIAGNOSIS — E559 Vitamin D deficiency, unspecified: Secondary | ICD-10-CM | POA: Diagnosis not present

## 2015-05-03 DIAGNOSIS — Z01810 Encounter for preprocedural cardiovascular examination: Secondary | ICD-10-CM | POA: Diagnosis not present

## 2015-05-03 DIAGNOSIS — M79609 Pain in unspecified limb: Secondary | ICD-10-CM | POA: Diagnosis not present

## 2015-05-15 DIAGNOSIS — M0579 Rheumatoid arthritis with rheumatoid factor of multiple sites without organ or systems involvement: Secondary | ICD-10-CM | POA: Diagnosis not present

## 2015-05-15 DIAGNOSIS — M25561 Pain in right knee: Secondary | ICD-10-CM | POA: Diagnosis not present

## 2015-05-15 DIAGNOSIS — M79641 Pain in right hand: Secondary | ICD-10-CM | POA: Diagnosis not present

## 2015-05-15 DIAGNOSIS — Z09 Encounter for follow-up examination after completed treatment for conditions other than malignant neoplasm: Secondary | ICD-10-CM | POA: Diagnosis not present

## 2015-05-19 DIAGNOSIS — M1711 Unilateral primary osteoarthritis, right knee: Secondary | ICD-10-CM | POA: Diagnosis not present

## 2015-06-08 DIAGNOSIS — M1711 Unilateral primary osteoarthritis, right knee: Secondary | ICD-10-CM | POA: Diagnosis not present

## 2015-06-20 DIAGNOSIS — Z853 Personal history of malignant neoplasm of breast: Secondary | ICD-10-CM | POA: Diagnosis not present

## 2015-06-20 DIAGNOSIS — E039 Hypothyroidism, unspecified: Secondary | ICD-10-CM | POA: Diagnosis present

## 2015-06-20 DIAGNOSIS — N189 Chronic kidney disease, unspecified: Secondary | ICD-10-CM | POA: Diagnosis present

## 2015-06-20 DIAGNOSIS — E7800 Pure hypercholesterolemia, unspecified: Secondary | ICD-10-CM | POA: Diagnosis present

## 2015-06-20 DIAGNOSIS — K210 Gastro-esophageal reflux disease with esophagitis: Secondary | ICD-10-CM | POA: Diagnosis present

## 2015-06-20 DIAGNOSIS — Z471 Aftercare following joint replacement surgery: Secondary | ICD-10-CM | POA: Diagnosis not present

## 2015-06-20 DIAGNOSIS — Z888 Allergy status to other drugs, medicaments and biological substances status: Secondary | ICD-10-CM | POA: Diagnosis not present

## 2015-06-20 DIAGNOSIS — Z96651 Presence of right artificial knee joint: Secondary | ICD-10-CM | POA: Diagnosis not present

## 2015-06-20 DIAGNOSIS — M1711 Unilateral primary osteoarthritis, right knee: Secondary | ICD-10-CM | POA: Diagnosis not present

## 2015-06-20 DIAGNOSIS — Z79899 Other long term (current) drug therapy: Secondary | ICD-10-CM | POA: Diagnosis not present

## 2015-06-20 DIAGNOSIS — F418 Other specified anxiety disorders: Secondary | ICD-10-CM | POA: Diagnosis present

## 2015-06-20 DIAGNOSIS — E785 Hyperlipidemia, unspecified: Secondary | ICD-10-CM | POA: Diagnosis present

## 2015-06-20 DIAGNOSIS — I129 Hypertensive chronic kidney disease with stage 1 through stage 4 chronic kidney disease, or unspecified chronic kidney disease: Secondary | ICD-10-CM | POA: Diagnosis present

## 2015-06-24 DIAGNOSIS — N189 Chronic kidney disease, unspecified: Secondary | ICD-10-CM | POA: Diagnosis not present

## 2015-06-24 DIAGNOSIS — Z96651 Presence of right artificial knee joint: Secondary | ICD-10-CM | POA: Diagnosis not present

## 2015-06-24 DIAGNOSIS — F329 Major depressive disorder, single episode, unspecified: Secondary | ICD-10-CM | POA: Diagnosis not present

## 2015-06-24 DIAGNOSIS — I129 Hypertensive chronic kidney disease with stage 1 through stage 4 chronic kidney disease, or unspecified chronic kidney disease: Secondary | ICD-10-CM | POA: Diagnosis not present

## 2015-06-24 DIAGNOSIS — F419 Anxiety disorder, unspecified: Secondary | ICD-10-CM | POA: Diagnosis not present

## 2015-06-24 DIAGNOSIS — Z79891 Long term (current) use of opiate analgesic: Secondary | ICD-10-CM | POA: Diagnosis not present

## 2015-06-24 DIAGNOSIS — Z7901 Long term (current) use of anticoagulants: Secondary | ICD-10-CM | POA: Diagnosis not present

## 2015-06-24 DIAGNOSIS — Z471 Aftercare following joint replacement surgery: Secondary | ICD-10-CM | POA: Diagnosis not present

## 2015-06-26 DIAGNOSIS — Z96651 Presence of right artificial knee joint: Secondary | ICD-10-CM | POA: Diagnosis not present

## 2015-06-26 DIAGNOSIS — F419 Anxiety disorder, unspecified: Secondary | ICD-10-CM | POA: Diagnosis not present

## 2015-06-26 DIAGNOSIS — N189 Chronic kidney disease, unspecified: Secondary | ICD-10-CM | POA: Diagnosis not present

## 2015-06-26 DIAGNOSIS — F329 Major depressive disorder, single episode, unspecified: Secondary | ICD-10-CM | POA: Diagnosis not present

## 2015-06-26 DIAGNOSIS — Z471 Aftercare following joint replacement surgery: Secondary | ICD-10-CM | POA: Diagnosis not present

## 2015-06-26 DIAGNOSIS — I129 Hypertensive chronic kidney disease with stage 1 through stage 4 chronic kidney disease, or unspecified chronic kidney disease: Secondary | ICD-10-CM | POA: Diagnosis not present

## 2015-06-27 DIAGNOSIS — Z96651 Presence of right artificial knee joint: Secondary | ICD-10-CM | POA: Diagnosis not present

## 2015-06-27 DIAGNOSIS — N189 Chronic kidney disease, unspecified: Secondary | ICD-10-CM | POA: Diagnosis not present

## 2015-06-27 DIAGNOSIS — Z471 Aftercare following joint replacement surgery: Secondary | ICD-10-CM | POA: Diagnosis not present

## 2015-06-27 DIAGNOSIS — F419 Anxiety disorder, unspecified: Secondary | ICD-10-CM | POA: Diagnosis not present

## 2015-06-27 DIAGNOSIS — F329 Major depressive disorder, single episode, unspecified: Secondary | ICD-10-CM | POA: Diagnosis not present

## 2015-06-27 DIAGNOSIS — I129 Hypertensive chronic kidney disease with stage 1 through stage 4 chronic kidney disease, or unspecified chronic kidney disease: Secondary | ICD-10-CM | POA: Diagnosis not present

## 2015-06-28 DIAGNOSIS — Z96651 Presence of right artificial knee joint: Secondary | ICD-10-CM | POA: Diagnosis not present

## 2015-06-28 DIAGNOSIS — F419 Anxiety disorder, unspecified: Secondary | ICD-10-CM | POA: Diagnosis not present

## 2015-06-28 DIAGNOSIS — N189 Chronic kidney disease, unspecified: Secondary | ICD-10-CM | POA: Diagnosis not present

## 2015-06-28 DIAGNOSIS — Z471 Aftercare following joint replacement surgery: Secondary | ICD-10-CM | POA: Diagnosis not present

## 2015-06-28 DIAGNOSIS — I129 Hypertensive chronic kidney disease with stage 1 through stage 4 chronic kidney disease, or unspecified chronic kidney disease: Secondary | ICD-10-CM | POA: Diagnosis not present

## 2015-06-28 DIAGNOSIS — F329 Major depressive disorder, single episode, unspecified: Secondary | ICD-10-CM | POA: Diagnosis not present

## 2015-06-29 DIAGNOSIS — Z96651 Presence of right artificial knee joint: Secondary | ICD-10-CM | POA: Diagnosis not present

## 2015-06-29 DIAGNOSIS — Z471 Aftercare following joint replacement surgery: Secondary | ICD-10-CM | POA: Diagnosis not present

## 2015-06-29 DIAGNOSIS — F329 Major depressive disorder, single episode, unspecified: Secondary | ICD-10-CM | POA: Diagnosis not present

## 2015-06-29 DIAGNOSIS — N189 Chronic kidney disease, unspecified: Secondary | ICD-10-CM | POA: Diagnosis not present

## 2015-06-29 DIAGNOSIS — F419 Anxiety disorder, unspecified: Secondary | ICD-10-CM | POA: Diagnosis not present

## 2015-06-29 DIAGNOSIS — I129 Hypertensive chronic kidney disease with stage 1 through stage 4 chronic kidney disease, or unspecified chronic kidney disease: Secondary | ICD-10-CM | POA: Diagnosis not present

## 2015-06-30 DIAGNOSIS — F419 Anxiety disorder, unspecified: Secondary | ICD-10-CM | POA: Diagnosis not present

## 2015-06-30 DIAGNOSIS — N189 Chronic kidney disease, unspecified: Secondary | ICD-10-CM | POA: Diagnosis not present

## 2015-06-30 DIAGNOSIS — Z471 Aftercare following joint replacement surgery: Secondary | ICD-10-CM | POA: Diagnosis not present

## 2015-06-30 DIAGNOSIS — F329 Major depressive disorder, single episode, unspecified: Secondary | ICD-10-CM | POA: Diagnosis not present

## 2015-06-30 DIAGNOSIS — Z96651 Presence of right artificial knee joint: Secondary | ICD-10-CM | POA: Diagnosis not present

## 2015-06-30 DIAGNOSIS — I129 Hypertensive chronic kidney disease with stage 1 through stage 4 chronic kidney disease, or unspecified chronic kidney disease: Secondary | ICD-10-CM | POA: Diagnosis not present

## 2015-07-03 DIAGNOSIS — Z96651 Presence of right artificial knee joint: Secondary | ICD-10-CM | POA: Diagnosis not present

## 2015-07-03 DIAGNOSIS — N189 Chronic kidney disease, unspecified: Secondary | ICD-10-CM | POA: Diagnosis not present

## 2015-07-03 DIAGNOSIS — F329 Major depressive disorder, single episode, unspecified: Secondary | ICD-10-CM | POA: Diagnosis not present

## 2015-07-03 DIAGNOSIS — Z471 Aftercare following joint replacement surgery: Secondary | ICD-10-CM | POA: Diagnosis not present

## 2015-07-03 DIAGNOSIS — F419 Anxiety disorder, unspecified: Secondary | ICD-10-CM | POA: Diagnosis not present

## 2015-07-03 DIAGNOSIS — I129 Hypertensive chronic kidney disease with stage 1 through stage 4 chronic kidney disease, or unspecified chronic kidney disease: Secondary | ICD-10-CM | POA: Diagnosis not present

## 2015-07-04 DIAGNOSIS — M25561 Pain in right knee: Secondary | ICD-10-CM | POA: Diagnosis not present

## 2015-07-04 DIAGNOSIS — M1711 Unilateral primary osteoarthritis, right knee: Secondary | ICD-10-CM | POA: Diagnosis not present

## 2015-07-04 DIAGNOSIS — M6281 Muscle weakness (generalized): Secondary | ICD-10-CM | POA: Diagnosis not present

## 2015-07-04 DIAGNOSIS — R2689 Other abnormalities of gait and mobility: Secondary | ICD-10-CM | POA: Diagnosis not present

## 2015-07-07 DIAGNOSIS — M1711 Unilateral primary osteoarthritis, right knee: Secondary | ICD-10-CM | POA: Diagnosis not present

## 2015-07-07 DIAGNOSIS — M25561 Pain in right knee: Secondary | ICD-10-CM | POA: Diagnosis not present

## 2015-07-07 DIAGNOSIS — M6281 Muscle weakness (generalized): Secondary | ICD-10-CM | POA: Diagnosis not present

## 2015-07-07 DIAGNOSIS — R2689 Other abnormalities of gait and mobility: Secondary | ICD-10-CM | POA: Diagnosis not present

## 2015-07-11 DIAGNOSIS — M25561 Pain in right knee: Secondary | ICD-10-CM | POA: Diagnosis not present

## 2015-07-11 DIAGNOSIS — R2689 Other abnormalities of gait and mobility: Secondary | ICD-10-CM | POA: Diagnosis not present

## 2015-07-11 DIAGNOSIS — M6281 Muscle weakness (generalized): Secondary | ICD-10-CM | POA: Diagnosis not present

## 2015-07-11 DIAGNOSIS — M1711 Unilateral primary osteoarthritis, right knee: Secondary | ICD-10-CM | POA: Diagnosis not present

## 2015-07-14 DIAGNOSIS — R2689 Other abnormalities of gait and mobility: Secondary | ICD-10-CM | POA: Diagnosis not present

## 2015-07-14 DIAGNOSIS — M25561 Pain in right knee: Secondary | ICD-10-CM | POA: Diagnosis not present

## 2015-07-14 DIAGNOSIS — M1711 Unilateral primary osteoarthritis, right knee: Secondary | ICD-10-CM | POA: Diagnosis not present

## 2015-07-14 DIAGNOSIS — M6281 Muscle weakness (generalized): Secondary | ICD-10-CM | POA: Diagnosis not present

## 2015-07-17 DIAGNOSIS — M25561 Pain in right knee: Secondary | ICD-10-CM | POA: Diagnosis not present

## 2015-07-17 DIAGNOSIS — M1711 Unilateral primary osteoarthritis, right knee: Secondary | ICD-10-CM | POA: Diagnosis not present

## 2015-07-17 DIAGNOSIS — R2689 Other abnormalities of gait and mobility: Secondary | ICD-10-CM | POA: Diagnosis not present

## 2015-07-17 DIAGNOSIS — M6281 Muscle weakness (generalized): Secondary | ICD-10-CM | POA: Diagnosis not present

## 2015-07-20 DIAGNOSIS — M6281 Muscle weakness (generalized): Secondary | ICD-10-CM | POA: Diagnosis not present

## 2015-07-20 DIAGNOSIS — M1711 Unilateral primary osteoarthritis, right knee: Secondary | ICD-10-CM | POA: Diagnosis not present

## 2015-07-20 DIAGNOSIS — M25561 Pain in right knee: Secondary | ICD-10-CM | POA: Diagnosis not present

## 2015-07-20 DIAGNOSIS — R2689 Other abnormalities of gait and mobility: Secondary | ICD-10-CM | POA: Diagnosis not present

## 2015-07-24 DIAGNOSIS — M25561 Pain in right knee: Secondary | ICD-10-CM | POA: Diagnosis not present

## 2015-07-24 DIAGNOSIS — R2689 Other abnormalities of gait and mobility: Secondary | ICD-10-CM | POA: Diagnosis not present

## 2015-07-24 DIAGNOSIS — M6281 Muscle weakness (generalized): Secondary | ICD-10-CM | POA: Diagnosis not present

## 2015-07-24 DIAGNOSIS — M1711 Unilateral primary osteoarthritis, right knee: Secondary | ICD-10-CM | POA: Diagnosis not present

## 2015-07-26 DIAGNOSIS — M1711 Unilateral primary osteoarthritis, right knee: Secondary | ICD-10-CM | POA: Diagnosis not present

## 2015-07-26 DIAGNOSIS — M25561 Pain in right knee: Secondary | ICD-10-CM | POA: Diagnosis not present

## 2015-07-26 DIAGNOSIS — M6281 Muscle weakness (generalized): Secondary | ICD-10-CM | POA: Diagnosis not present

## 2015-07-26 DIAGNOSIS — R2689 Other abnormalities of gait and mobility: Secondary | ICD-10-CM | POA: Diagnosis not present

## 2015-07-28 DIAGNOSIS — M6281 Muscle weakness (generalized): Secondary | ICD-10-CM | POA: Diagnosis not present

## 2015-07-28 DIAGNOSIS — R2689 Other abnormalities of gait and mobility: Secondary | ICD-10-CM | POA: Diagnosis not present

## 2015-07-28 DIAGNOSIS — M1711 Unilateral primary osteoarthritis, right knee: Secondary | ICD-10-CM | POA: Diagnosis not present

## 2015-07-28 DIAGNOSIS — M25561 Pain in right knee: Secondary | ICD-10-CM | POA: Diagnosis not present

## 2015-07-31 DIAGNOSIS — M6281 Muscle weakness (generalized): Secondary | ICD-10-CM | POA: Diagnosis not present

## 2015-07-31 DIAGNOSIS — R2689 Other abnormalities of gait and mobility: Secondary | ICD-10-CM | POA: Diagnosis not present

## 2015-07-31 DIAGNOSIS — M25561 Pain in right knee: Secondary | ICD-10-CM | POA: Diagnosis not present

## 2015-07-31 DIAGNOSIS — Z96651 Presence of right artificial knee joint: Secondary | ICD-10-CM | POA: Diagnosis not present

## 2015-07-31 DIAGNOSIS — M1711 Unilateral primary osteoarthritis, right knee: Secondary | ICD-10-CM | POA: Diagnosis not present

## 2015-08-02 DIAGNOSIS — M1711 Unilateral primary osteoarthritis, right knee: Secondary | ICD-10-CM | POA: Diagnosis not present

## 2015-08-02 DIAGNOSIS — R2689 Other abnormalities of gait and mobility: Secondary | ICD-10-CM | POA: Diagnosis not present

## 2015-08-02 DIAGNOSIS — M25561 Pain in right knee: Secondary | ICD-10-CM | POA: Diagnosis not present

## 2015-08-02 DIAGNOSIS — M6281 Muscle weakness (generalized): Secondary | ICD-10-CM | POA: Diagnosis not present

## 2015-08-07 DIAGNOSIS — R928 Other abnormal and inconclusive findings on diagnostic imaging of breast: Secondary | ICD-10-CM | POA: Diagnosis not present

## 2015-08-07 DIAGNOSIS — C50312 Malignant neoplasm of lower-inner quadrant of left female breast: Secondary | ICD-10-CM | POA: Diagnosis not present

## 2015-08-07 DIAGNOSIS — Z9889 Other specified postprocedural states: Secondary | ICD-10-CM | POA: Diagnosis not present

## 2015-08-08 DIAGNOSIS — M1711 Unilateral primary osteoarthritis, right knee: Secondary | ICD-10-CM | POA: Diagnosis not present

## 2015-08-08 DIAGNOSIS — R2689 Other abnormalities of gait and mobility: Secondary | ICD-10-CM | POA: Diagnosis not present

## 2015-08-08 DIAGNOSIS — M25561 Pain in right knee: Secondary | ICD-10-CM | POA: Diagnosis not present

## 2015-08-08 DIAGNOSIS — M6281 Muscle weakness (generalized): Secondary | ICD-10-CM | POA: Diagnosis not present

## 2015-08-09 DIAGNOSIS — Z853 Personal history of malignant neoplasm of breast: Secondary | ICD-10-CM | POA: Diagnosis not present

## 2015-08-09 DIAGNOSIS — C50912 Malignant neoplasm of unspecified site of left female breast: Secondary | ICD-10-CM | POA: Diagnosis not present

## 2015-08-09 DIAGNOSIS — Z79810 Long term (current) use of selective estrogen receptor modulators (SERMs): Secondary | ICD-10-CM | POA: Diagnosis not present

## 2015-08-10 DIAGNOSIS — R2689 Other abnormalities of gait and mobility: Secondary | ICD-10-CM | POA: Diagnosis not present

## 2015-08-10 DIAGNOSIS — M25561 Pain in right knee: Secondary | ICD-10-CM | POA: Diagnosis not present

## 2015-08-10 DIAGNOSIS — M6281 Muscle weakness (generalized): Secondary | ICD-10-CM | POA: Diagnosis not present

## 2015-08-10 DIAGNOSIS — M1711 Unilateral primary osteoarthritis, right knee: Secondary | ICD-10-CM | POA: Diagnosis not present

## 2015-08-15 DIAGNOSIS — L570 Actinic keratosis: Secondary | ICD-10-CM | POA: Diagnosis not present

## 2015-08-15 DIAGNOSIS — L578 Other skin changes due to chronic exposure to nonionizing radiation: Secondary | ICD-10-CM | POA: Diagnosis not present

## 2015-08-15 DIAGNOSIS — L821 Other seborrheic keratosis: Secondary | ICD-10-CM | POA: Diagnosis not present

## 2015-08-17 DIAGNOSIS — M25561 Pain in right knee: Secondary | ICD-10-CM | POA: Diagnosis not present

## 2015-08-17 DIAGNOSIS — M6281 Muscle weakness (generalized): Secondary | ICD-10-CM | POA: Diagnosis not present

## 2015-08-17 DIAGNOSIS — R2689 Other abnormalities of gait and mobility: Secondary | ICD-10-CM | POA: Diagnosis not present

## 2015-08-17 DIAGNOSIS — M1711 Unilateral primary osteoarthritis, right knee: Secondary | ICD-10-CM | POA: Diagnosis not present

## 2015-08-21 DIAGNOSIS — I10 Essential (primary) hypertension: Secondary | ICD-10-CM | POA: Diagnosis not present

## 2015-09-25 DIAGNOSIS — Z79899 Other long term (current) drug therapy: Secondary | ICD-10-CM | POA: Diagnosis not present

## 2015-09-25 DIAGNOSIS — M069 Rheumatoid arthritis, unspecified: Secondary | ICD-10-CM | POA: Diagnosis not present

## 2015-10-13 DIAGNOSIS — Z79899 Other long term (current) drug therapy: Secondary | ICD-10-CM | POA: Diagnosis not present

## 2015-10-13 DIAGNOSIS — M2550 Pain in unspecified joint: Secondary | ICD-10-CM | POA: Diagnosis not present

## 2015-10-17 DIAGNOSIS — M19041 Primary osteoarthritis, right hand: Secondary | ICD-10-CM | POA: Diagnosis not present

## 2015-10-17 DIAGNOSIS — Z79899 Other long term (current) drug therapy: Secondary | ICD-10-CM | POA: Diagnosis not present

## 2015-10-17 DIAGNOSIS — M0579 Rheumatoid arthritis with rheumatoid factor of multiple sites without organ or systems involvement: Secondary | ICD-10-CM | POA: Diagnosis not present

## 2015-11-10 ENCOUNTER — Other Ambulatory Visit: Payer: Self-pay

## 2015-11-27 DIAGNOSIS — I10 Essential (primary) hypertension: Secondary | ICD-10-CM | POA: Diagnosis not present

## 2015-12-07 DIAGNOSIS — Z853 Personal history of malignant neoplasm of breast: Secondary | ICD-10-CM | POA: Diagnosis not present

## 2015-12-07 DIAGNOSIS — Z79810 Long term (current) use of selective estrogen receptor modulators (SERMs): Secondary | ICD-10-CM | POA: Diagnosis not present

## 2015-12-11 DIAGNOSIS — Z23 Encounter for immunization: Secondary | ICD-10-CM | POA: Diagnosis not present

## 2015-12-13 DIAGNOSIS — Z96651 Presence of right artificial knee joint: Secondary | ICD-10-CM | POA: Diagnosis not present

## 2016-02-16 ENCOUNTER — Other Ambulatory Visit: Payer: Self-pay | Admitting: Rheumatology

## 2016-02-16 MED ORDER — HYDROXYCHLOROQUINE SULFATE 200 MG PO TABS: 200.0000 mg | ORAL_TABLET | Freq: Every day | ORAL | 1 refills | Status: DC

## 2016-02-16 NOTE — Telephone Encounter (Signed)
Last Visit: 10/17/15 Next Visit: 03/25/16 PLQ eye exam: 03/27/15 WNL Labs: 10/23/15 C/W previous  Okay to refill PLQ?

## 2016-02-16 NOTE — Telephone Encounter (Signed)
Patient is out of her PLQ, please send an rx to [facility] in [place], store ph# [phone]

## 2016-02-19 DIAGNOSIS — E7800 Pure hypercholesterolemia, unspecified: Secondary | ICD-10-CM | POA: Diagnosis not present

## 2016-02-19 DIAGNOSIS — E559 Vitamin D deficiency, unspecified: Secondary | ICD-10-CM | POA: Diagnosis not present

## 2016-02-19 DIAGNOSIS — M810 Age-related osteoporosis without current pathological fracture: Secondary | ICD-10-CM | POA: Diagnosis not present

## 2016-02-19 DIAGNOSIS — M8580 Other specified disorders of bone density and structure, unspecified site: Secondary | ICD-10-CM | POA: Diagnosis not present

## 2016-02-19 DIAGNOSIS — I10 Essential (primary) hypertension: Secondary | ICD-10-CM | POA: Diagnosis not present

## 2016-02-19 DIAGNOSIS — F411 Generalized anxiety disorder: Secondary | ICD-10-CM | POA: Diagnosis not present

## 2016-02-19 DIAGNOSIS — F330 Major depressive disorder, recurrent, mild: Secondary | ICD-10-CM | POA: Diagnosis not present

## 2016-02-19 DIAGNOSIS — N183 Chronic kidney disease, stage 3 (moderate): Secondary | ICD-10-CM | POA: Diagnosis not present

## 2016-02-19 DIAGNOSIS — M1990 Unspecified osteoarthritis, unspecified site: Secondary | ICD-10-CM | POA: Diagnosis not present

## 2016-02-19 DIAGNOSIS — M0609 Rheumatoid arthritis without rheumatoid factor, multiple sites: Secondary | ICD-10-CM | POA: Diagnosis not present

## 2016-02-19 DIAGNOSIS — K219 Gastro-esophageal reflux disease without esophagitis: Secondary | ICD-10-CM | POA: Diagnosis not present

## 2016-02-19 DIAGNOSIS — Z1389 Encounter for screening for other disorder: Secondary | ICD-10-CM | POA: Diagnosis not present

## 2016-02-19 DIAGNOSIS — E039 Hypothyroidism, unspecified: Secondary | ICD-10-CM | POA: Diagnosis not present

## 2016-03-02 ENCOUNTER — Other Ambulatory Visit: Payer: Self-pay | Admitting: Rheumatology

## 2016-03-06 NOTE — Telephone Encounter (Signed)
July 2017 normal eye exam  Last visit 10/17/15 Next visit 03/25/16 10/13/15 labs normal Ok to refill per Dr Estanislado Pandy

## 2016-03-20 DIAGNOSIS — L578 Other skin changes due to chronic exposure to nonionizing radiation: Secondary | ICD-10-CM | POA: Diagnosis not present

## 2016-03-20 DIAGNOSIS — L821 Other seborrheic keratosis: Secondary | ICD-10-CM | POA: Diagnosis not present

## 2016-03-20 DIAGNOSIS — L570 Actinic keratosis: Secondary | ICD-10-CM | POA: Diagnosis not present

## 2016-03-25 ENCOUNTER — Ambulatory Visit (INDEPENDENT_AMBULATORY_CARE_PROVIDER_SITE_OTHER): Payer: PRIVATE HEALTH INSURANCE | Admitting: Rheumatology

## 2016-03-25 ENCOUNTER — Encounter: Payer: Self-pay | Admitting: Rheumatology

## 2016-03-25 VITALS — BP 139/62 | HR 67 | Resp 13 | Ht 61.0 in | Wt 140.0 lb

## 2016-03-25 DIAGNOSIS — M8580 Other specified disorders of bone density and structure, unspecified site: Secondary | ICD-10-CM | POA: Diagnosis not present

## 2016-03-25 DIAGNOSIS — Z79899 Other long term (current) drug therapy: Secondary | ICD-10-CM

## 2016-03-25 DIAGNOSIS — M0609 Rheumatoid arthritis without rheumatoid factor, multiple sites: Secondary | ICD-10-CM | POA: Diagnosis not present

## 2016-03-25 DIAGNOSIS — M170 Bilateral primary osteoarthritis of knee: Secondary | ICD-10-CM | POA: Diagnosis not present

## 2016-03-25 MED ORDER — HYDROXYCHLOROQUINE SULFATE 200 MG PO TABS: 200.0000 mg | ORAL_TABLET | Freq: Every day | ORAL | 1 refills | Status: DC

## 2016-03-25 NOTE — Patient Instructions (Addendum)
    Updated fax number: [phone]         '$ Supplements for OA Natural anti-inflammatories  You can purchase these at [place], [employer] or online.  . Turmeric (capsules)  . Ginger (ginger root or capsules)  . Omega 3 (Fish, flax seeds, chia seeds, walnuts, almonds)  . Tart cherry (dried or extract)   Patient should be under the care of a physician while taking these supplements. This may not be reproduced without the permission of Dr. Bo Merino.

## 2016-03-25 NOTE — Progress Notes (Signed)
Office Visit Note  Patient: Marilyn Richard             Date of Birth: 1926/04/24           MRN: [id number]             PCP: Townsend Roger, MD Referring: Townsend Roger, MD Visit Date: 03/25/2016 Occupation: '@GUAROCC'$ @    Subjective:  No chief complaint on file. Follow-up on rheumatoid arthritis.  History of Present Illness: Marilyn Richard is a 83 y.o. female  Patient is doing well with the rheumatoid arthritis. No joint pain swelling or stiffness Taking Plaquenil 200 mg daily Last plan I exam was normal January 2017 and she is planning on going next week for her repeat Plaquenil eye exam.  Some ongoing pain to the right knee joint despite having right total knee replacement. It is bearable she wants to use Voltaren gel. I've discussed the proper quantities to use it patient is using it sparingly with good results  She has ongoing hand pain. She has a history of OA of the hands. She is using very small amount of Voltaren gel and she can increase the dosage for more adequate relief  She tried to get V/Q or the care hand brace but it was $500 and expensive and she could not get it filled at the time  Patient is able to afford her Plaquenil 20 mg daily to optimize. She states it is free dropped him Rx for her   Activities of Daily Living:  Patient reports morning stiffness for 15 minutes.   Patient Denies nocturnal pain.  Difficulty dressing/grooming: Denies Difficulty climbing stairs: Denies Difficulty getting out of chair: Denies Difficulty using hands for taps, buttons, cutlery, and/or writing: Denies   Review of Systems  Constitutional: Negative for fatigue.  HENT: Negative for mouth sores and mouth dryness.   Eyes: Negative for dryness.  Respiratory: Negative for shortness of breath.   Gastrointestinal: Negative for constipation and diarrhea.  Musculoskeletal: Negative for myalgias and myalgias.  Skin: Negative for sensitivity to sunlight.    Psychiatric/Behavioral: Negative for decreased concentration and sleep disturbance.    PMFS History:  There are no active problems to display for this patient.   Past Medical History:  Diagnosis Date  . Breast cancer ([place])   . Hypertension   . Hypothyroidism   . Inflammatory arthritis   . Osteoarthritis     Family History  Problem Relation Age of Onset  . Cancer Mother   . Cancer Father   . Cancer Sister    Past Surgical History:  Procedure Laterality Date  . ABDOMINAL HYSTERECTOMY    . BREAST SURGERY Left    Lumpectomy   . CHOLECYSTECTOMY    . CYSTOCELE REPAIR    . RECTOCELE REPAIR    . REPLACEMENT TOTAL KNEE Right   . TUBAL LIGATION     Social History   Social History Narrative  . No narrative on file     Objective: Vital Signs: BP 139/62 (BP Location: Left Arm, Patient Position: Sitting, Cuff Size: Large)   Pulse 67   Resp 13   Ht '5\' 1"'$  (1.549 m)   Wt 140 lb (63.5 kg)   BMI 26.45 kg/m    Physical Exam  Constitutional: She is oriented to person, place, and time. She appears well-developed and well-nourished.  HENT:  Head: Normocephalic and atraumatic.  Eyes: EOM are normal. Pupils are equal, round, and reactive to light.  Cardiovascular: Normal rate, regular  rhythm and normal heart sounds.  Exam reveals no gallop and no friction rub.   No murmur heard. Pulmonary/Chest: Effort normal and breath sounds normal. She has no wheezes. She has no rales.  Abdominal: Soft. Bowel sounds are normal. She exhibits no distension. There is no tenderness. There is no guarding. No hernia.  Musculoskeletal: Normal range of motion. She exhibits no edema, tenderness or deformity.  Lymphadenopathy:    She has no cervical adenopathy.  Neurological: She is alert and oriented to person, place, and time. Coordination normal.  Skin: Skin is warm and dry. Capillary refill takes less than 2 seconds. No rash noted.  Psychiatric: She has a normal mood and affect. Her behavior is  normal.  Nursing note and vitals reviewed.    Musculoskeletal Exam:    CDAI Exam: CDAI Homunculus Exam:   Joint Counts:  CDAI Tender Joint count: 0 CDAI Swollen Joint count: 0  Global Assessments:  Patient Global Assessment: 3 Provider Global Assessment: 3  CDAI Calculated Score: 6    Investigation: No additional findings.  Labs from 02/19/2016 shows CBC with differential normal CMP with GFR is normal except for GFR slightly low at 54 but acceptable range. TSH is normal at 2.34 Vitamin D is normal at 40.5 T4 is normal at 1.53  Imaging: No results found.  Speciality Comments: No specialty comments available.    Procedures:  No procedures performed Allergies: Wasp venom; Levaquin [levofloxacin in d5w]; and Lorabid [loracarbef]   Assessment / Plan:     Visit Diagnoses: Rheumatoid arthritis, seronegative, multiple sites ([place])  High risk medications (not anticoagulants) long-term use  Primary osteoarthritis of both knees  Osteopenia, unspecified location - due feb 2018; pcp orders   Plan: #1: Rheumatoid arthritis, seronegative. Adequate response with Plaquenil 200 mg daily  #2: Plaquenil eye exam due January 2018. Last years Plaquenil eye exam was normal  #3: Prednisone 2 mg every morning  #4: If patient calls [place] for new prescription, we can write view Q [facility] care hand brace. Last time she checked, it was $500 and too expensive for the patient. This year the cost may be covered by insurance and patient will check and see  #5: Next visit in 5 months  #6: We'll do CBC with differential, CMP with GFR in 5 months if she hasn't already had a done through her PCPs office  #7: Recent labs from December 2017 are normal. This is CBC with differential and CMP with GFR  Orders: No orders of the defined types were placed in this encounter.  No orders of the defined types were placed in this encounter.   Face-to-face time spent with patient was 30 minutes. 50%  of time was spent in counseling and coordination of care.  Follow-Up Instructions: No Follow-up on file.   Eliezer Lofts, PA-C  Note - This record has been created using [employer].  Chart creation errors have been sought, but may not always  have been located. Such creation errors do not reflect on  the standard of medical care.

## 2016-04-04 DIAGNOSIS — M069 Rheumatoid arthritis, unspecified: Secondary | ICD-10-CM | POA: Diagnosis not present

## 2016-04-04 DIAGNOSIS — H26493 Other secondary cataract, bilateral: Secondary | ICD-10-CM | POA: Diagnosis not present

## 2016-04-04 DIAGNOSIS — Z79899 Other long term (current) drug therapy: Secondary | ICD-10-CM | POA: Diagnosis not present

## 2016-04-08 DIAGNOSIS — Z170 Estrogen receptor positive status [ER+]: Secondary | ICD-10-CM | POA: Diagnosis not present

## 2016-04-08 DIAGNOSIS — Z853 Personal history of malignant neoplasm of breast: Secondary | ICD-10-CM | POA: Diagnosis not present

## 2016-04-08 DIAGNOSIS — Z79810 Long term (current) use of selective estrogen receptor modulators (SERMs): Secondary | ICD-10-CM | POA: Diagnosis not present

## 2016-04-08 DIAGNOSIS — C50912 Malignant neoplasm of unspecified site of left female breast: Secondary | ICD-10-CM | POA: Diagnosis not present

## 2016-04-08 DIAGNOSIS — Z79899 Other long term (current) drug therapy: Secondary | ICD-10-CM | POA: Diagnosis not present

## 2016-04-10 ENCOUNTER — Encounter: Payer: Self-pay | Admitting: *Deleted

## 2016-04-10 DIAGNOSIS — Z79899 Other long term (current) drug therapy: Secondary | ICD-10-CM | POA: Insufficient documentation

## 2016-05-18 DIAGNOSIS — J189 Pneumonia, unspecified organism: Secondary | ICD-10-CM | POA: Diagnosis not present

## 2016-05-31 DIAGNOSIS — I10 Essential (primary) hypertension: Secondary | ICD-10-CM | POA: Diagnosis not present

## 2016-06-18 DIAGNOSIS — Z96651 Presence of right artificial knee joint: Secondary | ICD-10-CM | POA: Diagnosis not present

## 2016-07-08 ENCOUNTER — Telehealth: Payer: Self-pay | Admitting: Rheumatology

## 2016-07-08 MED ORDER — DICLOFENAC SODIUM 1 % TD GEL: TRANSDERMAL | 3 refills | Status: DC

## 2016-07-08 NOTE — Telephone Encounter (Signed)
03/25/16 last visit  08/22/16 next visit  Ok to refill per Dr Estanislado Pandy

## 2016-07-08 NOTE — Telephone Encounter (Addendum)
Patient running low on Voltaren Creme. Request rx to be called into [facility]. Please call patient when rx called in.

## 2016-07-18 ENCOUNTER — Telehealth: Payer: Self-pay | Admitting: Rheumatology

## 2016-07-18 NOTE — Telephone Encounter (Signed)
A prior authorization for voltaren gel has been submitted via cover my meds. Will update and contact patient once we have received a response.   Letticia Bhattacharyya, [place], CPhT 3:32 PM

## 2016-07-18 NOTE — Telephone Encounter (Signed)
Patient calling because she was told by [facility] that a prior authorization is needed for patients Volteran Gel 1%. Pharmacy [phone]. Please call patient and let her know when authorization is obtained.

## 2016-07-19 ENCOUNTER — Telehealth: Payer: Self-pay

## 2016-07-19 NOTE — Telephone Encounter (Signed)
Received a fax from [facility] regarding a prior authorization DENIAL for Voltaren Gel.   Reference number:[id number] Phone [phone]  Will send document to scan center.  Spoke to patient to update her. Told her that her insurance would want her to try one of the following medications first in order to be considered for coverage of Voltaren Gel. (Celecoxib, Diflunisal, Etodolac, Flurbiprofen, Ketoprofen, Meloxicam, Naproxen or Sulindac) She has one tube left. I Told patient about the GoodRx coupon the we could provide her with at her next appointment (6/13). She plans to discuss her options then.    Osiris Charles, [place], CPhT  8:16 AM

## 2016-08-07 DIAGNOSIS — C50312 Malignant neoplasm of lower-inner quadrant of left female breast: Secondary | ICD-10-CM | POA: Diagnosis not present

## 2016-08-07 DIAGNOSIS — R928 Other abnormal and inconclusive findings on diagnostic imaging of breast: Secondary | ICD-10-CM | POA: Diagnosis not present

## 2016-08-14 DIAGNOSIS — Z853 Personal history of malignant neoplasm of breast: Secondary | ICD-10-CM | POA: Diagnosis not present

## 2016-08-22 ENCOUNTER — Encounter: Payer: Self-pay | Admitting: Rheumatology

## 2016-08-22 ENCOUNTER — Ambulatory Visit (INDEPENDENT_AMBULATORY_CARE_PROVIDER_SITE_OTHER): Payer: Medicare Other | Admitting: Rheumatology

## 2016-08-22 VITALS — BP 136/70 | HR 72 | Resp 16 | Ht 61.0 in | Wt 142.0 lb

## 2016-08-22 DIAGNOSIS — M170 Bilateral primary osteoarthritis of knee: Secondary | ICD-10-CM | POA: Diagnosis not present

## 2016-08-22 DIAGNOSIS — M8580 Other specified disorders of bone density and structure, unspecified site: Secondary | ICD-10-CM

## 2016-08-22 DIAGNOSIS — M0609 Rheumatoid arthritis without rheumatoid factor, multiple sites: Secondary | ICD-10-CM

## 2016-08-22 DIAGNOSIS — Z79899 Other long term (current) drug therapy: Secondary | ICD-10-CM

## 2016-08-22 LAB — CBC WITH DIFFERENTIAL/PLATELET
Basophils Absolute: 0 cells/uL (ref 0–200)
Basophils Relative: 0 %
EOS ABS: 58 {cells}/uL (ref 15–500)
Eosinophils Relative: 1 %
HCT: 39.1 % (ref 35.0–45.0)
Hemoglobin: 12.6 g/dL (ref 11.7–15.5)
LYMPHS PCT: 29 %
Lymphs Abs: 1682 cells/uL (ref 850–3900)
MCH: 28.6 pg (ref 27.0–33.0)
MCHC: 32.2 g/dL (ref 32.0–36.0)
MCV: 88.9 fL (ref 80.0–100.0)
MONOS PCT: 7 %
MPV: 10.4 fL (ref 7.5–12.5)
Monocytes Absolute: 406 cells/uL (ref 200–950)
Neutro Abs: 3654 cells/uL (ref 1500–7800)
Neutrophils Relative %: 63 %
Platelets: 252 10*3/uL (ref 140–400)
RBC: 4.4 MIL/uL (ref 3.80–5.10)
RDW: 13.7 % (ref 11.0–15.0)
WBC: 5.8 10*3/uL (ref 3.8–10.8)

## 2016-08-22 MED ORDER — DICLOFENAC SODIUM 1 % TD GEL: TRANSDERMAL | 3 refills | Status: DC

## 2016-08-22 MED ORDER — DICLOFENAC SODIUM 1 % TD GEL
TRANSDERMAL | 3 refills | Status: AC
Start: 2016-08-22 — End: ?

## 2016-08-22 NOTE — Progress Notes (Signed)
Office Visit Note  Patient: Marilyn Richard             Date of Birth: Jun 17, 1926           MRN: [id number]             PCP: Townsend Roger, MD Referring: Townsend Roger, MD Visit Date: 08/22/2016 Occupation: @GUAROCC @    Subjective:  Medication Management   History of Present Illness: Marilyn Richard is a 83 y.o. female  Last seen March 25, 2016  She was seen for rheumatoid arthritis. At the last visit, patient had no joint pain, swelling, stiffness. She is doing well with Plaquenil 200 mg daily. She was due for Plaquenil eye exam will we saw her back in January 2018.  Patient had Plaquenil eye exam done through Dr. Revonda Humphrey office. We do have that documentation. The eye exam was normal. We see report for full details.     Activities of Daily Living:  Patient reports morning stiffness for 15 minutes.   Patient Denies nocturnal pain.  Difficulty dressing/grooming: Denies Difficulty climbing stairs: Denies Difficulty getting out of chair: Denies Difficulty using hands for taps, buttons, cutlery, and/or writing: Denies   Review of Systems  Constitutional: Negative for fatigue.  HENT: Negative for mouth sores and mouth dryness.   Eyes: Negative for dryness.  Respiratory: Negative for shortness of breath.   Gastrointestinal: Negative for constipation and diarrhea.  Musculoskeletal: Negative for myalgias and myalgias.  Skin: Negative for sensitivity to sunlight.  Psychiatric/Behavioral: Negative for decreased concentration and sleep disturbance.    PMFS History:  Patient Active Problem List   Diagnosis Date Noted  . High risk medication use 04/10/2016    Past Medical History:  Diagnosis Date  . Breast cancer ([place])   . Hypertension   . Hypothyroidism   . Inflammatory arthritis   . Osteoarthritis     Family History  Problem Relation Age of Onset  . Cancer Mother   . Cancer Father   . Cancer Sister    Past Surgical History:  Procedure Laterality  Date  . ABDOMINAL HYSTERECTOMY    . BREAST SURGERY Left    Lumpectomy   . CHOLECYSTECTOMY    . CYSTOCELE REPAIR    . RECTOCELE REPAIR    . REPLACEMENT TOTAL KNEE Right   . TUBAL LIGATION     Social History   Social History Narrative  . No narrative on file     Objective: Vital Signs: BP 136/70   Pulse 72   Resp 16   Ht 5\' 1"  (1.549 m)   Wt 142 lb (64.4 kg)   BMI 26.83 kg/m    Physical Exam  Constitutional: She is oriented to person, place, and time. She appears well-developed and well-nourished.  HENT:  Head: Normocephalic and atraumatic.  Eyes: EOM are normal. Pupils are equal, round, and reactive to light.  Cardiovascular: Normal rate, regular rhythm and normal heart sounds.  Exam reveals no gallop and no friction rub.   No murmur heard. Pulmonary/Chest: Effort normal and breath sounds normal. She has no wheezes. She has no rales.  Abdominal: Soft. Bowel sounds are normal. She exhibits no distension. There is no tenderness. There is no guarding. No hernia.  Musculoskeletal: Normal range of motion. She exhibits no edema, tenderness or deformity.  Lymphadenopathy:    She has no cervical adenopathy.  Neurological: She is alert and oriented to person, place, and time. Coordination normal.  Skin: Skin is warm and dry.  Capillary refill takes less than 2 seconds. No rash noted.  Psychiatric: She has a normal mood and affect. Her behavior is normal.  Nursing note and vitals reviewed.    Musculoskeletal Exam:  Full range of motion of all joints Grip strength is equal and strong bilaterally Fibromyalgia tender points are all absent  CDAI Exam: CDAI Homunculus Exam:   Joint Counts:  CDAI Tender Joint count: 0 CDAI Swollen Joint count: 0  Global Assessments:  Patient Global Assessment: 2 Provider Global Assessment: 2  CDAI Calculated Score: 4    Investigation: No additional findings.  No visits with results within 6 Month(s) from this visit.  Latest known  visit with results is:  No results found for any previous visit.     Imaging: No results found.  Speciality Comments: No specialty comments available.    Procedures:  No procedures performed Allergies: Wasp venom; Keflex [cephalexin]; Levaquin [levofloxacin in d5w]; and Lorabid [loracarbef]   Assessment / Plan:     Visit Diagnoses: Rheumatoid arthritis, seronegative, multiple sites ([place])  High risk medications (not anticoagulants) long-term use - Plan: CBC with Differential/Platelet, COMPLETE METABOLIC PANEL WITH GFR  Primary osteoarthritis of both knees  Osteopenia, unspecified location   Plan: #1: Right and appear A as follows because patient was denied Voltaren gel by her insurance company and she needs it and she qualifies. Note: Center as follows Part D Appeals and grievance department [place] [address] Fax: [phone]  Patient has tried and failed the following medication in the past suggested by the insurance company: Celebrex, meloxicam, sulindac, Patient's kidney function is also a concern for [place] at this time. Since Voltaren gel as a topical medication that can 8: Controlled patient's pain and inflammation; B: Avoid aggravating patient's kidney function better than the oral NSAID's, we choose to give the patient Voltaren gel. She still uses it sparingly despite having a moderate amount of pain. We request you to fill the patient's Voltaren gel as follows Voltaren gel 3 tubes with 3 refills  #2: Rheumatoid arthritis Doing well. No complaint. Plaquenil 200 mg daily. Plaquenil eye exam is normal and up-to-date  #3: Return to clinic in 5 months  #4: Osteopenia. Being addressed by PCP Most recent bone density showed osteopenia Patient is taking vitamin D, watching her calcium, doing weightbearing exercise  #5: Osteoarthritis of bilateral knees. Right knee with total knee replacement. Doing well. Left knee doing  well. Occasional pain.  Orders: Orders Placed This Encounter  Procedures  . CBC with Differential/Platelet  . COMPLETE METABOLIC PANEL WITH GFR   Meds ordered this encounter  Medications  . DISCONTD: diclofenac sodium (VOLTAREN) 1 % GEL    Sig: 3 grams tid prn    Dispense:  3 Tube    Refill:  3    Order Specific Question:   Supervising Provider    Answer:   Bo Merino [[id number]]  . diclofenac sodium (VOLTAREN) 1 % GEL    Sig: 3 grams tid prn    Dispense:  3 Tube    Refill:  3    Order Specific Question:   Supervising Provider    Answer:   Lyda Perone    Face-to-face time spent with patient was 30 minutes. 50% of time was spent in counseling and coordination of care.  Follow-Up Instructions: No Follow-up on file.   Eliezer Lofts, PA-C  Note - This record has been created using [employer].  Chart creation errors have been sought, but  may not always  have been located. Such creation errors do not reflect on  the standard of medical care.

## 2016-08-23 ENCOUNTER — Ambulatory Visit: Payer: Medicare Other | Admitting: Rheumatology

## 2016-08-23 LAB — COMPLETE METABOLIC PANEL WITH GFR
ALT: 13 U/L (ref 6–29)
AST: 17 U/L (ref 10–35)
Albumin: 4 g/dL (ref 3.6–5.1)
Alkaline Phosphatase: 31 U/L — ABNORMAL LOW (ref 33–130)
BUN: 21 mg/dL (ref 7–25)
CO2: 25 mmol/L (ref 20–31)
Calcium: 9 mg/dL (ref 8.6–10.4)
Chloride: 98 mmol/L (ref 98–110)
Creat: 1.08 mg/dL — ABNORMAL HIGH (ref 0.60–0.88)
GFR, EST NON AFRICAN AMERICAN: 46 mL/min — AB (ref >=60)
GFR, Est African American: 53 mL/min — ABNORMAL LOW (ref >=60)
Glucose, Bld: 104 mg/dL — ABNORMAL HIGH (ref 65–99)
Potassium: 3.7 mmol/L (ref 3.5–5.3)
Sodium: 135 mmol/L (ref 135–146)
Total Bilirubin: 0.4 mg/dL (ref 0.2–1.2)
Total Protein: 5.9 g/dL — ABNORMAL LOW (ref 6.1–8.1)

## 2016-09-02 DIAGNOSIS — I10 Essential (primary) hypertension: Secondary | ICD-10-CM | POA: Diagnosis not present

## 2016-09-02 DIAGNOSIS — M069 Rheumatoid arthritis, unspecified: Secondary | ICD-10-CM | POA: Diagnosis not present

## 2016-09-05 ENCOUNTER — Other Ambulatory Visit: Payer: Self-pay | Admitting: Rheumatology

## 2016-09-05 NOTE — Telephone Encounter (Signed)
Inform patient that kidney function shows mild elevation of Cr at 1.08 and slight decrease in GFR to 46; both vaulues are slightly worse compared to last time her blood was checked.  Continue PLQ as RX'd for now (we may adjust dose to every other day if needed if next labs show worsening kidney function);   ===> repeat cmp w/ gfr in 2 months.  ==> I will refill plq for now.  ==> drink adequate amount of water  ==> if pt is on diuretic (lasix, hctz, and other meds that affect kidney function), pt should let pcp know that Cr and GFR are being affected.

## 2016-09-05 NOTE — Telephone Encounter (Signed)
Last Visit: 08/22/16 Next Visit: 01/22/17 Labs: 08/22/16 CMP with GFR shows nonfasting glucose mildly elevated; mild elevation of creatinine at 1.08 and moderate decrease in GFR at 46 Previous Creat. 1.02 GFR 50 PLQ Eye Exam: 04/04/16 WNL  Okay to refill PLQ?

## 2016-09-25 DIAGNOSIS — L300 Nummular dermatitis: Secondary | ICD-10-CM | POA: Diagnosis not present

## 2016-09-25 DIAGNOSIS — L570 Actinic keratosis: Secondary | ICD-10-CM | POA: Diagnosis not present

## 2016-09-25 DIAGNOSIS — L578 Other skin changes due to chronic exposure to nonionizing radiation: Secondary | ICD-10-CM | POA: Diagnosis not present

## 2016-09-25 DIAGNOSIS — L821 Other seborrheic keratosis: Secondary | ICD-10-CM | POA: Diagnosis not present

## 2016-10-11 DIAGNOSIS — M8588 Other specified disorders of bone density and structure, other site: Secondary | ICD-10-CM | POA: Diagnosis not present

## 2016-10-11 DIAGNOSIS — M85852 Other specified disorders of bone density and structure, left thigh: Secondary | ICD-10-CM | POA: Diagnosis not present

## 2016-10-28 DIAGNOSIS — M069 Rheumatoid arthritis, unspecified: Secondary | ICD-10-CM | POA: Diagnosis not present

## 2016-10-28 DIAGNOSIS — Z79899 Other long term (current) drug therapy: Secondary | ICD-10-CM | POA: Diagnosis not present

## 2016-11-05 ENCOUNTER — Telehealth: Payer: Self-pay | Admitting: Radiology

## 2016-11-05 NOTE — Telephone Encounter (Signed)
Eye exam shows no occular complications from Plaquenil have sent for scanning

## 2016-11-21 ENCOUNTER — Other Ambulatory Visit: Payer: Self-pay | Admitting: Rheumatology

## 2016-11-21 NOTE — Telephone Encounter (Signed)
Last Visit: 08/22/16 Next Visit: 01/22/17 Labs: 08/22/16 Creat 1.08 Previously 1.02 GFR 46 Previously 50 PLQ Eye Exam: 04/04/16 WNL  Okay to refill PLQ?

## 2016-11-21 NOTE — Telephone Encounter (Signed)
ok 

## 2016-12-11 DIAGNOSIS — M069 Rheumatoid arthritis, unspecified: Secondary | ICD-10-CM | POA: Diagnosis not present

## 2016-12-11 DIAGNOSIS — R42 Dizziness and giddiness: Secondary | ICD-10-CM | POA: Diagnosis not present

## 2017-01-07 ENCOUNTER — Ambulatory Visit (INDEPENDENT_AMBULATORY_CARE_PROVIDER_SITE_OTHER): Payer: Medicare Other | Admitting: Rheumatology

## 2017-01-07 ENCOUNTER — Encounter: Payer: Self-pay | Admitting: Rheumatology

## 2017-01-07 VITALS — BP 145/52 | HR 63 | Resp 18 | Ht 61.0 in | Wt 141.0 lb

## 2017-01-07 DIAGNOSIS — M8580 Other specified disorders of bone density and structure, unspecified site: Secondary | ICD-10-CM | POA: Diagnosis not present

## 2017-01-07 DIAGNOSIS — Z79899 Other long term (current) drug therapy: Secondary | ICD-10-CM | POA: Diagnosis not present

## 2017-01-07 DIAGNOSIS — M170 Bilateral primary osteoarthritis of knee: Secondary | ICD-10-CM

## 2017-01-07 DIAGNOSIS — M0609 Rheumatoid arthritis without rheumatoid factor, multiple sites: Secondary | ICD-10-CM

## 2017-01-07 LAB — CBC WITH DIFFERENTIAL/PLATELET
BASOS PCT: 0.3 %
Basophils Absolute: 19 cells/uL (ref 0–200)
Eosinophils Absolute: 19 cells/uL (ref 15–500)
Eosinophils Relative: 0.3 %
HCT: 38 % (ref 35.0–45.0)
Hemoglobin: 12.8 g/dL (ref 11.7–15.5)
Lymphs Abs: 1688 cells/uL (ref 850–3900)
MCH: 29.8 pg (ref 27.0–33.0)
MCHC: 33.7 g/dL (ref 32.0–36.0)
MCV: 88.4 fL (ref 80.0–100.0)
MPV: 10.6 fL (ref 7.5–12.5)
Monocytes Relative: 7.6 %
Neutro Abs: 4095 cells/uL (ref 1500–7800)
Neutrophils Relative %: 65 %
Platelets: 236 10*3/uL (ref 140–400)
RBC: 4.3 10*6/uL (ref 3.80–5.10)
RDW: 12 % (ref 11.0–15.0)
Total Lymphocyte: 26.8 %
WBC mixed population: 479 cells/uL (ref 200–950)
WBC: 6.3 10*3/uL (ref 3.8–10.8)

## 2017-01-07 LAB — COMPLETE METABOLIC PANEL WITH GFR
AG Ratio: 2.2 (calc) (ref 1.0–2.5)
ALKALINE PHOSPHATASE (APISO): 30 U/L — AB (ref 33–130)
ALT: 12 U/L (ref 6–29)
AST: 16 U/L (ref 10–35)
Albumin: 4.3 g/dL (ref 3.6–5.1)
BUN: 20 mg/dL (ref 7–25)
CO2: 27 mmol/L (ref 20–32)
CREATININE: 0.84 mg/dL (ref 0.60–0.88)
Calcium: 9.4 mg/dL (ref 8.6–10.4)
Chloride: 99 mmol/L (ref 98–110)
GFR, Est African American: 72 mL/min/{1.73_m2} (ref >=60)
GFR, Est Non African American: 62 mL/min/{1.73_m2} (ref >=60)
GLUCOSE: 78 mg/dL (ref 65–99)
Globulin: 2 g/dL (calc) (ref 1.9–3.7)
Potassium: 4.7 mmol/L (ref 3.5–5.3)
Sodium: 135 mmol/L (ref 135–146)
Total Bilirubin: 0.5 mg/dL (ref 0.2–1.2)
Total Protein: 6.3 g/dL (ref 6.1–8.1)

## 2017-01-07 NOTE — Progress Notes (Signed)
Office Visit Note  Patient: Marilyn Richard             Date of Birth: April 18, 1926           MRN: [id number]             PCP: Townsend Roger, MD Referring: Townsend Roger, MD Visit Date: 01/07/2017 Occupation: _0 @    Subjective:  No chief complaint on file.   History of Present Illness: Marilyn Richard is a 83 y.o. female  Who was last seen August 22, 2016 for rheumatoid arthritis.  Overall, the patient is doing well with her rheumatoid arthritis except she is having some increased pain to left hand and some MCP joints of the left hand.  She has been taking her Plaquenil daily.  Patient is unsure whether the cold weather or a flare of the rheumatoid arthritis is causing her increased pain.  Overall her morning stiffness is about 15 minutes.  Activities of Daily Living:  Patient reports morning stiffness for 15 minute.   Patient Reports nocturnal pain.  Difficulty dressing/grooming: Denies Difficulty climbing stairs: Reports Difficulty getting out of chair: Reports Difficulty using hands for taps, buttons, cutlery, and/or writing: Reports   Review of Systems  Constitutional: Negative for fatigue.  HENT: Negative for mouth sores and mouth dryness.   Eyes: Negative for dryness.  Respiratory: Negative for shortness of breath.   Gastrointestinal: Negative for constipation and diarrhea.  Musculoskeletal: Negative for myalgias and myalgias.  Skin: Negative for sensitivity to sunlight.  Psychiatric/Behavioral: Negative for decreased concentration and sleep disturbance.    PMFS History:  Patient Active Problem List   Diagnosis Date Noted  . High risk medication use 04/10/2016    Past Medical History:  Diagnosis Date  . Breast cancer ([place])   . Hypertension   . Hypothyroidism   . Inflammatory arthritis   . Osteoarthritis     Family History  Problem Relation Age of Onset  . Cancer Mother   . Cancer Father   . Cancer Sister    Past Surgical History:  Procedure  Laterality Date  . ABDOMINAL HYSTERECTOMY    . BREAST SURGERY Left    Lumpectomy   . CHOLECYSTECTOMY    . CYSTOCELE REPAIR    . RECTOCELE REPAIR    . REPLACEMENT TOTAL KNEE Right   . TUBAL LIGATION     Social History   Social History Narrative  . No narrative on file     Objective: Vital Signs: There were no vitals taken for this visit.   Physical Exam  Constitutional: She is oriented to person, place, and time. She appears well-developed and well-nourished.  HENT:  Head: Normocephalic and atraumatic.  Eyes: Pupils are equal, round, and reactive to light. EOM are normal.  Cardiovascular: Normal rate, regular rhythm and normal heart sounds.  Exam reveals no gallop and no friction rub.   No murmur heard. Pulmonary/Chest: Effort normal and breath sounds normal. She has no wheezes. She has no rales.  Abdominal: Soft. Bowel sounds are normal. She exhibits no distension. There is no tenderness. There is no guarding. No hernia.  Musculoskeletal: Normal range of motion. She exhibits no edema, tenderness or deformity.  Lymphadenopathy:    She has no cervical adenopathy.  Neurological: She is alert and oriented to person, place, and time. Coordination normal.  Skin: Skin is warm and dry. Capillary refill takes less than 2 seconds. No rash noted.  Psychiatric: She has a normal mood and affect. Her  behavior is normal.  Nursing note and vitals reviewed.    Musculoskeletal Exam:  Full range of motion of all joints Grip strength is equal and strong bilaterally Fibromyalgia tender points are all absent  CDAI Exam: No CDAI exam completed.  No synovitis on examination.  Investigation: No additional findings. Office Visit on 08/22/2016  Component Date Value Ref Range Status  . WBC 08/22/2016 5.8  3.8 - 10.8 K/uL Final  . RBC 08/22/2016 4.40  3.80 - 5.10 MIL/uL Final  . Hemoglobin 08/22/2016 12.6  11.7 - 15.5 g/dL Final  . HCT 08/22/2016 39.1  35.0 - 45.0 % Final  . MCV 08/22/2016  88.9  80.0 - 100.0 fL Final  . MCH 08/22/2016 28.6  27.0 - 33.0 pg Final  . MCHC 08/22/2016 32.2  32.0 - 36.0 g/dL Final  . RDW 08/22/2016 13.7  11.0 - 15.0 % Final  . Platelets 08/22/2016 252  140 - 400 K/uL Final  . MPV 08/22/2016 10.4  7.5 - 12.5 fL Final  . Neutro Abs 08/22/2016 3654  1,500 - 7,800 cells/uL Final  . Lymphs Abs 08/22/2016 1682  850 - 3,900 cells/uL Final  . Monocytes Absolute 08/22/2016 406  200 - 950 cells/uL Final  . Eosinophils Absolute 08/22/2016 58  15 - 500 cells/uL Final  . Basophils Absolute 08/22/2016 0  0 - 200 cells/uL Final  . Neutrophils Relative % 08/22/2016 63  % Final  . Lymphocytes Relative 08/22/2016 29  % Final  . Monocytes Relative 08/22/2016 7  % Final  . Eosinophils Relative 08/22/2016 1  % Final  . Basophils Relative 08/22/2016 0  % Final  . Smear Review 08/22/2016 Criteria for review not met   Final  . Sodium 08/22/2016 135  135 - 146 mmol/L Final  . Potassium 08/22/2016 3.7  3.5 - 5.3 mmol/L Final  . Chloride 08/22/2016 98  98 - 110 mmol/L Final  . CO2 08/22/2016 25  20 - 31 mmol/L Final  . Glucose, Bld 08/22/2016 104* 65 - 99 mg/dL Final  . BUN 08/22/2016 21  7 - 25 mg/dL Final  . Creat 08/22/2016 1.08* 0.60 - 0.88 mg/dL Final   Comment:   For patients > or = 81 years of age: The upper reference limit for Creatinine is approximately 13% higher for people identified as African-American.     . Total Bilirubin 08/22/2016 0.4  0.2 - 1.2 mg/dL Final  . Alkaline Phosphatase 08/22/2016 31* 33 - 130 U/L Final  . AST 08/22/2016 17  10 - 35 U/L Final  . ALT 08/22/2016 13  6 - 29 U/L Final  . Total Protein 08/22/2016 5.9* 6.1 - 8.1 g/dL Final  . Albumin 08/22/2016 4.0  3.6 - 5.1 g/dL Final  . Calcium 08/22/2016 9.0  8.6 - 10.4 mg/dL Final  . GFR, Est African American 08/22/2016 53* >=60 mL/min Final  . GFR, Est Non African American 08/22/2016 46* >=60 mL/min Final     Imaging: No results found.  Speciality Comments: No specialty comments  available.    Procedures:  No procedures performed Allergies: Wasp venom; Keflex [cephalexin]; Levaquin [levofloxacin in d5w]; and Lorabid [loracarbef]   Assessment / Plan:     Visit Diagnoses: Rheumatoid arthritis, seronegative, multiple sites ([place])  High risk medications (not anticoagulants) long-term use  Primary osteoarthritis of both knees  Osteopenia, unspecified location   Plan: 1: Rheumatoid arthritis. Doing well. Having some occasional pain especially to the MCP joints (left greater than right). Patient is compliant with the medication.  Taking Plaquenil 200 mg daily. Plaquenil eye exam is up-to-date and normal. Has done it at Dr[facility] Gershon Mussel [facility]  #2: High risk prescription Plaquenil 200 mg daily. CBC with differential and CMP with GFR will be done today. Last labs were done June 2018 and were within normal limits.  3.:  OA of hands. DIP PIP prominence.  4.:  Hand pain.  Most likely probably from all the way of the hands. Patient has ulnar deviation and I suspect all damage from rheumatoid arthritis is causing current discomfort due to the change of weather (cold during the winter months.)  #5: to rule out rheumatoid arthritis flare, I recommend we do ultrasound to look for synovitis. On exam, there was no synovitis.  The ultrasound will be scheduled on a Wednesday afternoon so office visit and ultrasound could be done at the same time.   Orders: No orders of the defined types were placed in this encounter.  No orders of the defined types were placed in this encounter.   Face-to-face time spent with patient was 30 minutes. 50% of time was spent in counseling and coordination of care.  Follow-Up Instructions: No Follow-up on file.   Eliezer Lofts, PA-C  Note - This record has been created using [employer].  Chart creation errors have been sought, but may not always  have been located. Such creation errors do not reflect on  the standard  of medical care.

## 2017-01-07 NOTE — Patient Instructions (Signed)
Schedule ultrasound for bilateral hands and wrists.

## 2017-01-08 NOTE — Progress Notes (Signed)
Within normal limits

## 2017-01-22 ENCOUNTER — Ambulatory Visit: Payer: Medicare Other | Admitting: Rheumatology

## 2017-01-22 DIAGNOSIS — R42 Dizziness and giddiness: Secondary | ICD-10-CM | POA: Diagnosis not present

## 2017-01-22 DIAGNOSIS — M6281 Muscle weakness (generalized): Secondary | ICD-10-CM | POA: Diagnosis not present

## 2017-01-22 DIAGNOSIS — R2681 Unsteadiness on feet: Secondary | ICD-10-CM | POA: Diagnosis not present

## 2017-01-22 DIAGNOSIS — M542 Cervicalgia: Secondary | ICD-10-CM | POA: Diagnosis not present

## 2017-01-27 ENCOUNTER — Other Ambulatory Visit: Payer: Self-pay | Admitting: Rheumatology

## 2017-01-27 NOTE — Telephone Encounter (Signed)
Last Visit: 01/07/17 Next Visit: 06/04/17 Labs: 01/07/17 WNL PLQ Eye Exam: 04/04/16 WNL   Okay to refill per Dr. Estanislado Pandy

## 2017-02-12 DIAGNOSIS — Z853 Personal history of malignant neoplasm of breast: Secondary | ICD-10-CM | POA: Diagnosis not present

## 2017-02-27 DIAGNOSIS — I10 Essential (primary) hypertension: Secondary | ICD-10-CM | POA: Diagnosis not present

## 2017-02-27 DIAGNOSIS — K219 Gastro-esophageal reflux disease without esophagitis: Secondary | ICD-10-CM | POA: Diagnosis not present

## 2017-02-27 DIAGNOSIS — N183 Chronic kidney disease, stage 3 (moderate): Secondary | ICD-10-CM | POA: Diagnosis not present

## 2017-02-27 DIAGNOSIS — M150 Primary generalized (osteo)arthritis: Secondary | ICD-10-CM | POA: Diagnosis not present

## 2017-02-27 DIAGNOSIS — E039 Hypothyroidism, unspecified: Secondary | ICD-10-CM | POA: Diagnosis not present

## 2017-02-27 DIAGNOSIS — F411 Generalized anxiety disorder: Secondary | ICD-10-CM | POA: Diagnosis not present

## 2017-02-27 DIAGNOSIS — E559 Vitamin D deficiency, unspecified: Secondary | ICD-10-CM | POA: Diagnosis not present

## 2017-02-27 DIAGNOSIS — E7800 Pure hypercholesterolemia, unspecified: Secondary | ICD-10-CM | POA: Diagnosis not present

## 2017-02-27 DIAGNOSIS — Z1389 Encounter for screening for other disorder: Secondary | ICD-10-CM | POA: Diagnosis not present

## 2017-02-27 DIAGNOSIS — M0609 Rheumatoid arthritis without rheumatoid factor, multiple sites: Secondary | ICD-10-CM | POA: Diagnosis not present

## 2017-02-27 DIAGNOSIS — M8580 Other specified disorders of bone density and structure, unspecified site: Secondary | ICD-10-CM | POA: Diagnosis not present

## 2017-02-27 DIAGNOSIS — Z6826 Body mass index (BMI) 26.0-26.9, adult: Secondary | ICD-10-CM | POA: Diagnosis not present

## 2017-03-17 ENCOUNTER — Telehealth: Payer: Self-pay | Admitting: Rheumatology

## 2017-03-18 NOTE — Telephone Encounter (Deleted)
S 

## 2017-03-18 NOTE — Telephone Encounter (Signed)
Correct Insurance has been placed with [facility].

## 2017-03-18 NOTE — Telephone Encounter (Signed)
SENT TO OFFICE TO SUBMIT CORRECT INSURANCE TO LABS THAT PROCESS ORDERS AT LAST VISIT

## 2017-04-11 ENCOUNTER — Other Ambulatory Visit: Payer: Self-pay | Admitting: Rheumatology

## 2017-04-11 NOTE — Telephone Encounter (Signed)
Last Visit: 01/07/17 Next Visit: 06/04/17 Labs: 01/07/17 WNL PLQ Eye Exam: 04/04/16 WNL   Okay to refill per Dr. Estanislado Pandy

## 2017-04-16 DIAGNOSIS — L821 Other seborrheic keratosis: Secondary | ICD-10-CM | POA: Diagnosis not present

## 2017-04-16 DIAGNOSIS — L578 Other skin changes due to chronic exposure to nonionizing radiation: Secondary | ICD-10-CM | POA: Diagnosis not present

## 2017-04-16 DIAGNOSIS — L570 Actinic keratosis: Secondary | ICD-10-CM | POA: Diagnosis not present

## 2017-05-12 DIAGNOSIS — M05741 Rheumatoid arthritis with rheumatoid factor of right hand without organ or systems involvement: Secondary | ICD-10-CM | POA: Diagnosis not present

## 2017-05-12 DIAGNOSIS — Z79899 Other long term (current) drug therapy: Secondary | ICD-10-CM | POA: Diagnosis not present

## 2017-05-12 DIAGNOSIS — H26493 Other secondary cataract, bilateral: Secondary | ICD-10-CM | POA: Diagnosis not present

## 2017-05-26 NOTE — Progress Notes (Signed)
Office Visit Note  Patient: Marilyn Richard             Date of Birth: 10/16/1926           MRN: [id number]             PCP: Townsend Roger, MD Referring: Townsend Roger, MD Visit Date: 06/04/2017 Occupation: @GUAROCC @    Subjective:  Medication Management   History of Present Illness: Marilyn Richard is a 84 y.o. female seronegative inflammatory arthritis.  She states she is not having any increased joint pain or swelling currently.  She does have increased pain with increased activity.  He denies any discomfort in her knees and feet currently.  Activities of Daily Living:  Patient reports morning stiffness for 15 minutes.   Patient Denies nocturnal pain.  Difficulty dressing/grooming: Denies Difficulty climbing stairs: Reports Difficulty getting out of chair: Denies Difficulty using hands for taps, buttons, cutlery, and/or writing: Reports   Review of Systems  Constitutional: Positive for fatigue. Negative for night sweats, weight gain and weight loss.  HENT: Negative for mouth sores, trouble swallowing, trouble swallowing, mouth dryness and nose dryness.   Eyes: Positive for dryness. Negative for pain, redness and visual disturbance.  Respiratory: Negative for cough, shortness of breath and difficulty breathing.   Cardiovascular: Negative for chest pain, palpitations, hypertension, irregular heartbeat and swelling in legs/feet.  Gastrointestinal: Negative for blood in stool, constipation and diarrhea.  Endocrine: Negative for increased urination.  Genitourinary: Negative for vaginal dryness.  Musculoskeletal: Positive for arthralgias, joint pain and morning stiffness. Negative for joint swelling, myalgias, muscle weakness, muscle tenderness and myalgias.  Skin: Negative for color change, rash, hair loss, skin tightness, ulcers and sensitivity to sunlight.  Allergic/Immunologic: Negative for susceptible to infections.  Neurological: Negative for dizziness, memory loss,  night sweats and weakness.  Hematological: Negative for swollen glands.  Psychiatric/Behavioral: Negative for depressed mood and sleep disturbance. The patient is not nervous/anxious.     PMFS History:  Patient Active Problem List   Diagnosis Date Noted  . High risk medication use 04/10/2016    Past Medical History:  Diagnosis Date  . Breast cancer ([place])   . Hypertension   . Hypothyroidism   . Inflammatory arthritis   . Osteoarthritis     Family History  Problem Relation Age of Onset  . Cancer Mother   . Cancer Father   . Cancer Sister    Past Surgical History:  Procedure Laterality Date  . ABDOMINAL HYSTERECTOMY    . BREAST SURGERY Left    Lumpectomy   . CHOLECYSTECTOMY    . CYSTOCELE REPAIR    . KNEE ARTHROPLASTY    . RECTOCELE REPAIR    . REPLACEMENT TOTAL KNEE Right   . TUBAL LIGATION     Social History   Social History Narrative  . Not on file     Objective: Vital Signs: BP 136/65 (BP Location: Right Arm, Patient Position: Sitting, Cuff Size: Normal)   Pulse 68   Resp 15   Ht 5\' 1"  (1.549 m)   Wt 142 lb (64.4 kg)   BMI 26.83 kg/m    Physical Exam  Constitutional: She is oriented to person, place, and time. She appears well-developed and well-nourished.  HENT:  Head: Normocephalic and atraumatic.  Eyes: Conjunctivae and EOM are normal.  Neck: Normal range of motion.  Cardiovascular: Normal rate, regular rhythm, normal heart sounds and intact distal pulses.  Pulmonary/Chest: Effort normal and breath sounds normal.  Abdominal: Soft. Bowel sounds are normal.  Lymphadenopathy:    She has no cervical adenopathy.  Neurological: She is alert and oriented to person, place, and time.  Skin: Skin is warm and dry. Capillary refill takes less than 2 seconds.  Psychiatric: She has a normal mood and affect. Her behavior is normal.  Nursing note and vitals reviewed.    Musculoskeletal Exam: C-spine was in good range of motion.  She has some thoracic kyphosis  and limited range of motion of lumbar spine.  Shoulder joints, elbow joints, wrist joints were in good range of motion.  She is synovial thickening over bilateral MCP joints.  DIP PIP thickening was noted.  Hip joints were in good range of motion.  She has a right total knee replacement.  She is overcrowding of toes with PIP and DIP thickening    CDAI Exam: CDAI Homunculus Exam:   Joint Counts:  CDAI Tender Joint count: 0 CDAI Swollen Joint count: 0  Global Assessments:  Patient Global Assessment: 4 Provider Global Assessment: 4  CDAI Calculated Score: 8    Investigation: No additional findings.PLQ eye exam: 04/04/2016 CBC Latest Ref Rng & Units 01/07/2017 08/22/2016  WBC 3.8 - 10.8 Thousand/uL 6.3 5.8  Hemoglobin 11.7 - 15.5 g/dL 12.8 12.6  Hematocrit 35.0 - 45.0 % 38.0 39.1  Platelets 140 - 400 Thousand/uL 236 252   CMP Latest Ref Rng & Units 01/07/2017 08/22/2016  Glucose 65 - 99 mg/dL 78 104(H)  BUN 7 - 25 mg/dL 20 21  Creatinine 0.60 - 0.88 mg/dL 0.84 1.08(H)  Sodium 135 - 146 mmol/L 135 135  Potassium 3.5 - 5.3 mmol/L 4.7 3.7  Chloride 98 - 110 mmol/L 99 98  CO2 20 - 32 mmol/L 27 25  Calcium 8.6 - 10.4 mg/dL 9.4 9.0  Total Protein 6.1 - 8.1 g/dL 6.3 5.9(L)  Total Bilirubin 0.2 - 1.2 mg/dL 0.5 0.4  Alkaline Phos 33 - 130 U/L - 31(L)  AST 10 - 35 U/L 16 17  ALT 6 - 29 U/L 12 13    Imaging: [place] Extrem Up Bilat Comp  Result Date: 06/04/2017 Ultrasound examination of bilateral hands was performed per EULAR recommendations. Using 12 MHz transducer, grayscale and power Doppler bilateral second, third, and fifth MCP joints and bilateral wrist joints both dorsal and volar aspects were evaluated to look for synovitis or tenosynovitis. The findings were there was no synovitis or tenosynovitis on ultrasound examination.  She has synovial thickening in the right second MCP joint.  Right median nerve was 0.09 cm squares which was within normal limits and left median nerve was 0.09  cm squares which was within normal limits. Impression: Ultrasound examination was consistent with inflammatory arthritis but no active synovitis and osteoarthritis.     Speciality Comments: No specialty comments available.    Procedures:  No procedures performed Allergies: Wasp venom; Keflex [cephalexin]; Levaquin [levofloxacin in d5w]; and Lorabid [loracarbef]   Assessment / Plan:     Visit Diagnoses: Inflammatory arthritis: She has no active synovitis on examination.  Ultrasound of bilateral hands obtained today did not show any synovitis.  High risk medication use - PLQeye exam: May 12, 2017 was normal.  Her labs have been stable.  She will get labs every 5-6 months.  Primary osteoarthritis of both hands: She has severe osteoarthritis in her hands.  Joint protection was discussed.  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  Status post total knee replacement, right: Doing  well with some warmth.  Primary osteoarthritis of both feet: She has overcrowding of her toes which causes discomfort.  The joint separator was discussed.  History of hypertension  History of breast cancer    Orders: Orders Placed This Encounter  Procedures  . [place] Extrem Up Bilat Comp   No orders of the defined types were placed in this encounter.     Follow-Up Instructions: Return in about 6 months (around 12/05/2017) for Rheumatoid arthritis, Osteoarthritis.   Bo Merino, MD  Note - This record has been created using [employer].  Chart creation errors have been sought, but may not always  have been located. Such creation errors do not reflect on  the standard of medical care.

## 2017-05-30 DIAGNOSIS — M0609 Rheumatoid arthritis without rheumatoid factor, multiple sites: Secondary | ICD-10-CM | POA: Diagnosis not present

## 2017-05-30 DIAGNOSIS — I10 Essential (primary) hypertension: Secondary | ICD-10-CM | POA: Diagnosis not present

## 2017-06-04 ENCOUNTER — Ambulatory Visit (INDEPENDENT_AMBULATORY_CARE_PROVIDER_SITE_OTHER): Payer: Self-pay

## 2017-06-04 ENCOUNTER — Encounter: Payer: Self-pay | Admitting: Rheumatology

## 2017-06-04 ENCOUNTER — Ambulatory Visit (INDEPENDENT_AMBULATORY_CARE_PROVIDER_SITE_OTHER): Payer: Medicare Other | Admitting: Rheumatology

## 2017-06-04 VITALS — BP 136/65 | HR 68 | Resp 15 | Ht 61.0 in | Wt 142.0 lb

## 2017-06-04 DIAGNOSIS — M19041 Primary osteoarthritis, right hand: Secondary | ICD-10-CM | POA: Diagnosis not present

## 2017-06-04 DIAGNOSIS — Z96651 Presence of right artificial knee joint: Secondary | ICD-10-CM | POA: Diagnosis not present

## 2017-06-04 DIAGNOSIS — Z8679 Personal history of other diseases of the circulatory system: Secondary | ICD-10-CM | POA: Diagnosis not present

## 2017-06-04 DIAGNOSIS — Z853 Personal history of malignant neoplasm of breast: Secondary | ICD-10-CM

## 2017-06-04 DIAGNOSIS — M19042 Primary osteoarthritis, left hand: Secondary | ICD-10-CM

## 2017-06-04 DIAGNOSIS — M1990 Unspecified osteoarthritis, unspecified site: Secondary | ICD-10-CM

## 2017-06-04 DIAGNOSIS — M79642 Pain in left hand: Secondary | ICD-10-CM

## 2017-06-04 DIAGNOSIS — M19071 Primary osteoarthritis, right ankle and foot: Secondary | ICD-10-CM

## 2017-06-04 DIAGNOSIS — M1712 Unilateral primary osteoarthritis, left knee: Secondary | ICD-10-CM

## 2017-06-04 DIAGNOSIS — Z79899 Other long term (current) drug therapy: Secondary | ICD-10-CM | POA: Diagnosis not present

## 2017-06-04 DIAGNOSIS — M79641 Pain in right hand: Secondary | ICD-10-CM

## 2017-06-04 DIAGNOSIS — M19072 Primary osteoarthritis, left ankle and foot: Secondary | ICD-10-CM

## 2017-08-11 DIAGNOSIS — C50312 Malignant neoplasm of lower-inner quadrant of left female breast: Secondary | ICD-10-CM | POA: Diagnosis not present

## 2017-08-11 DIAGNOSIS — Z9889 Other specified postprocedural states: Secondary | ICD-10-CM | POA: Diagnosis not present

## 2017-08-11 DIAGNOSIS — R922 Inconclusive mammogram: Secondary | ICD-10-CM | POA: Diagnosis not present

## 2017-08-15 DIAGNOSIS — Z79810 Long term (current) use of selective estrogen receptor modulators (SERMs): Secondary | ICD-10-CM | POA: Diagnosis not present

## 2017-08-15 DIAGNOSIS — Z853 Personal history of malignant neoplasm of breast: Secondary | ICD-10-CM | POA: Diagnosis not present

## 2017-09-05 DIAGNOSIS — I10 Essential (primary) hypertension: Secondary | ICD-10-CM | POA: Diagnosis not present

## 2017-10-08 ENCOUNTER — Other Ambulatory Visit: Payer: Self-pay

## 2017-10-10 DIAGNOSIS — L0390 Cellulitis, unspecified: Secondary | ICD-10-CM | POA: Diagnosis not present

## 2017-10-22 DIAGNOSIS — L821 Other seborrheic keratosis: Secondary | ICD-10-CM | POA: Diagnosis not present

## 2017-10-22 DIAGNOSIS — L820 Inflamed seborrheic keratosis: Secondary | ICD-10-CM | POA: Diagnosis not present

## 2017-10-22 DIAGNOSIS — L570 Actinic keratosis: Secondary | ICD-10-CM | POA: Diagnosis not present

## 2017-10-22 DIAGNOSIS — L578 Other skin changes due to chronic exposure to nonionizing radiation: Secondary | ICD-10-CM | POA: Diagnosis not present

## 2017-10-27 ENCOUNTER — Other Ambulatory Visit: Payer: Self-pay | Admitting: Rheumatology

## 2017-10-27 DIAGNOSIS — Z79899 Other long term (current) drug therapy: Secondary | ICD-10-CM

## 2017-10-27 NOTE — Telephone Encounter (Addendum)
Last Visit: 01/07/17 Next Visit: 12/17/17 Labs: 02/27/17 WNL PLQ Eye Exam: May 12, 2017  WNL  Patient advised she is due to update labs. Patient will go this week.   Okay to refill 30 day supply per Dr. Estanislado Pandy

## 2017-10-30 ENCOUNTER — Other Ambulatory Visit: Payer: Self-pay | Admitting: *Deleted

## 2017-10-30 ENCOUNTER — Other Ambulatory Visit: Payer: Self-pay

## 2017-10-30 DIAGNOSIS — Z79899 Other long term (current) drug therapy: Secondary | ICD-10-CM

## 2017-10-31 LAB — CBC WITH DIFFERENTIAL/PLATELET
BASOS PCT: 0.6 %
Basophils Absolute: 37 cells/uL (ref 0–200)
EOS ABS: 31 {cells}/uL (ref 15–500)
Eosinophils Relative: 0.5 %
HCT: 36.7 % (ref 35.0–45.0)
Hemoglobin: 12.4 g/dL (ref 11.7–15.5)
Lymphs Abs: 1693 cells/uL (ref 850–3900)
MCH: 29.1 pg (ref 27.0–33.0)
MCHC: 33.8 g/dL (ref 32.0–36.0)
MCV: 86.2 fL (ref 80.0–100.0)
MONOS PCT: 8.5 %
MPV: 10.7 fL (ref 7.5–12.5)
Neutro Abs: 3912 cells/uL (ref 1500–7800)
Neutrophils Relative %: 63.1 %
Platelets: 214 10*3/uL (ref 140–400)
RBC: 4.26 10*6/uL (ref 3.80–5.10)
RDW: 12.2 % (ref 11.0–15.0)
Total Lymphocyte: 27.3 %
WBC mixed population: 527 cells/uL (ref 200–950)
WBC: 6.2 10*3/uL (ref 3.8–10.8)

## 2017-10-31 LAB — COMPLETE METABOLIC PANEL WITH GFR
AG RATIO: 2.3 (calc) (ref 1.0–2.5)
ALKALINE PHOSPHATASE (APISO): 27 U/L — AB (ref 33–130)
ALT: 13 U/L (ref 6–29)
AST: 17 U/L (ref 10–35)
Albumin: 4.2 g/dL (ref 3.6–5.1)
BUN / CREAT RATIO: 21 (calc) (ref 6–22)
BUN: 22 mg/dL (ref 7–25)
CO2: 25 mmol/L (ref 20–32)
CREATININE: 1.03 mg/dL — AB (ref 0.60–0.88)
Calcium: 9.4 mg/dL (ref 8.6–10.4)
Chloride: 100 mmol/L (ref 98–110)
GFR, Est African American: 56 mL/min/{1.73_m2} — ABNORMAL LOW (ref >=60)
GFR, Est Non African American: 48 mL/min/{1.73_m2} — ABNORMAL LOW (ref >=60)
GLOBULIN: 1.8 g/dL — AB (ref 1.9–3.7)
Glucose, Bld: 86 mg/dL (ref 65–99)
Potassium: 4.4 mmol/L (ref 3.5–5.3)
SODIUM: 136 mmol/L (ref 135–146)
Total Bilirubin: 0.4 mg/dL (ref 0.2–1.2)
Total Protein: 6 g/dL — ABNORMAL LOW (ref 6.1–8.1)

## 2017-10-31 NOTE — Progress Notes (Signed)
Labs are stable.

## 2017-11-12 DIAGNOSIS — Z79899 Other long term (current) drug therapy: Secondary | ICD-10-CM | POA: Diagnosis not present

## 2017-11-12 DIAGNOSIS — H524 Presbyopia: Secondary | ICD-10-CM | POA: Diagnosis not present

## 2017-11-12 DIAGNOSIS — M05741 Rheumatoid arthritis with rheumatoid factor of right hand without organ or systems involvement: Secondary | ICD-10-CM | POA: Diagnosis not present

## 2017-12-04 NOTE — Progress Notes (Signed)
Office Visit Note  Patient: Marilyn Richard             Date of Birth: [birth year]           MRN: [id number]             PCP: Townsend Roger, MD Referring: Townsend Roger, MD Visit Date: 12/17/2017 Occupation: @GUAROCC @  Subjective:  Diarrhea   History of Present Illness: Marilyn Richard is a 84 y.o. female with history of inflammatory arthritis and osteoarthritis.  She reports she has been having diarrhea off and on for the past several months.  She is concerned the diarrhea is related to PLQ use.  She has been off of PLQ for 4 days. She also discontinued taking magnesium and her stool softener. She has not noticed much improvement in the diarrhea.  She reports she has not changed her eating habits.  She has not seen a GI specialist.  She denies any blood or mucus in the stool.  She denies any sick contacts. She denies any joint pain or joint swelling at this time.  She takes tylenol BID.  She uses voltaren gel topically on the right knee that is replaced.  She denies any left knee pain or swelling.     Activities of Daily Living:  Patient reports morning stiffness for 3  minutes.   Patient Denies nocturnal pain.  Difficulty dressing/grooming: Denies Difficulty climbing stairs: Reports Difficulty getting out of chair: Reports Difficulty using hands for taps, buttons, cutlery, and/or writing: Reports  Review of Systems  Constitutional: Negative for fatigue.  HENT: Negative for mouth sores, mouth dryness and nose dryness.   Eyes: Positive for dryness (Eye drops). Negative for pain and visual disturbance.  Respiratory: Positive for cough. Negative for hemoptysis, shortness of breath and difficulty breathing.   Cardiovascular: Negative for chest pain, palpitations, hypertension and swelling in legs/feet.  Gastrointestinal: Positive for diarrhea. Negative for blood in stool and constipation.  Endocrine: Negative for increased urination.  Genitourinary: Negative for painful  urination.  Musculoskeletal: Positive for morning stiffness. Negative for arthralgias, joint pain, joint swelling, myalgias, muscle weakness, muscle tenderness and myalgias.  Skin: Negative for color change, pallor, rash, hair loss, nodules/bumps, skin tightness, ulcers and sensitivity to sunlight.  Allergic/Immunologic: Negative for susceptible to infections.  Neurological: Negative for dizziness, numbness, headaches and weakness.  Hematological: Negative for swollen glands.  Psychiatric/Behavioral: Negative for depressed mood and sleep disturbance. The patient is not nervous/anxious.     PMFS History:  Patient Active Problem List   Diagnosis Date Noted  . High risk medication use 04/10/2016    Past Medical History:  Diagnosis Date  . Breast cancer ([place])   . Hypertension   . Hypothyroidism   . Inflammatory arthritis   . Osteoarthritis     Family History  Problem Relation Age of Onset  . Cancer Mother   . Cancer Father   . Cancer Sister    Past Surgical History:  Procedure Laterality Date  . ABDOMINAL HYSTERECTOMY    . BREAST SURGERY Left    Lumpectomy   . CHOLECYSTECTOMY    . CYSTOCELE REPAIR    . KNEE ARTHROPLASTY    . RECTOCELE REPAIR    . REPLACEMENT TOTAL KNEE Right   . TUBAL LIGATION     Social History   Social History Narrative  . Not on file    Objective: Vital Signs: BP 133/65 (BP Location: Right Arm, Patient Position: Sitting, Cuff Size: Normal)   Pulse  63   Resp 13   Ht 5\' 1"  (1.549 m)   Wt 141 lb 12.8 oz (64.3 kg)   BMI 26.79 kg/m    Physical Exam  Constitutional: She is oriented to person, place, and time. She appears well-developed and well-nourished.  HENT:  Head: Normocephalic and atraumatic.  Eyes: Conjunctivae and EOM are normal.  Neck: Normal range of motion.  Cardiovascular: Normal rate, regular rhythm, normal heart sounds and intact distal pulses.  Pulmonary/Chest: Effort normal and breath sounds normal.  Abdominal: Soft. Bowel  sounds are normal.  Lymphadenopathy:    She has no cervical adenopathy.  Neurological: She is alert and oriented to person, place, and time.  Skin: Skin is warm and dry. Capillary refill takes less than 2 seconds.  Psychiatric: She has a normal mood and affect. Her behavior is normal.  Nursing note and vitals reviewed.    Musculoskeletal Exam: C_spine limited extension.  Thoracic and lumbar spine good ROM.  No midline spinal tenderness.  No SI joint tenderness. Shoulder joints, elbow joints, wrist joints, MCPs, PIPs, and DIPs good ROM with no synovitis.  Ulnar deviation bilaterally.  Synovial thickening of all MCPs, PIPs, and DIPs.  Hip joint good ROM with no discomfort. Right knee replacement is doing well.  No warmth or effusion of knee joints.  No tenderness or swelling of ankle joints. Overcrowding of toes. Left 1st MTP synovial thickening.   CDAI Exam: CDAI Score: Not documented Patient Global Assessment: Not documented; Provider Global Assessment: Not documented Swollen: Not documented; Tender: Not documented Joint Exam   Not documented   There is currently no information documented on the homunculus. Go to the Rheumatology activity and complete the homunculus joint exam.  Investigation: No additional findings.  Imaging: No results found.  Recent Labs: Lab Results  Component Value Date   WBC 6.2 10/30/2017   HGB 12.4 10/30/2017   PLT 214 10/30/2017   NA 136 10/30/2017   K 4.4 10/30/2017   CL 100 10/30/2017   CO2 25 10/30/2017   GLUCOSE 86 10/30/2017   BUN 22 10/30/2017   CREATININE 1.03 (H) 10/30/2017   BILITOT 0.4 10/30/2017   ALKPHOS 31 (L) 08/22/2016   AST 17 10/30/2017   ALT 13 10/30/2017   PROT 6.0 (L) 10/30/2017   ALBUMIN 4.0 08/22/2016   CALCIUM 9.4 10/30/2017   GFRAA 56 (L) 10/30/2017    Speciality Comments: PLQ eye exam: normal on 10/28/16 via [facility]  Procedures:  No procedures performed Allergies: Wasp venom; Keflex [cephalexin];  Levaquin [levofloxacin in d5w]; and Lorabid [loracarbef]   Assessment / Plan:     Visit Diagnoses: Inflammatory arthritis - ultrasound obtained on 06/04/2017 did not show any synovitis: She has no synovitis on exam. She has not had any recent flares. She has been off of PLQ for 4 days due to the patient being concerned that PLQ is causing diarrhea.  She has had diarrhea off and on for a few months. She is clinically doing well and the ultrasound performed on 06/04/17 did not reveal synovitis so we discussed not restarting PLQ at this time. She requested CCP, RF, and 14-3-3 eta to be checked today.  She was advised to notify us if she develops increased joint pain or joint swelling.  She will follow up in 5 months. - Plan: Cyclic citrul peptide antibody, IgG, 14-3-3 eta Protein, Rheumatoid factor  High risk medication use - PLQ. eye exam: 05/12/2017. CBC and CMP were drawn on 10/30/17.   Primary osteoarthritis  of both hands: She has PIP and DIP synovial thickening. Joint protection and muscle strengthening were discussed.   Primary osteoarthritis of left knee - chondromalacia patella: No warmth or effusion.  Good ROM with no discomfort at this time.   Status post total knee replacement, right: Doing well.  She occasionally uses voltaren gel topically.   Primary osteoarthritis of both feet: She has PIP and DIP synovial thickening in both feet. She wears proper fitting shoes.   Other medical conditions are listed as follows:   History of breast cancer  History of hypertension   Orders: Orders Placed This Encounter  Procedures  . Cyclic citrul peptide antibody, IgG  . 14-3-3 eta Protein  . Rheumatoid factor   No orders of the defined types were placed in this encounter.   Follow-Up Instructions: Return for inflammatory arthritis , Osteoarthritis.   Ofilia Neas, PA-C   I examined and evaluated the patient with Hazel Sams PA.  Patient had no synovitis on examination.  Although she has  significant synovial thickening and a DIP and PIP thickening on examination.  She has discontinued Plaquenil due to diarrhea.  We will hold off Plaquenil for right now.  We have advised patient to contact us in case she develops more inflammation.  If her diarrhea persists she should schedule an appointment with gastroenterologist.  The plan of care was discussed as noted above.  Bo Merino, MD  Note - This record has been created using Editor, commissioning.  Chart creation errors have been sought, but may not always  have been located. Such creation errors do not reflect on  the standard of medical care.

## 2017-12-05 DIAGNOSIS — I10 Essential (primary) hypertension: Secondary | ICD-10-CM | POA: Diagnosis not present

## 2017-12-05 DIAGNOSIS — Z23 Encounter for immunization: Secondary | ICD-10-CM | POA: Diagnosis not present

## 2017-12-17 ENCOUNTER — Ambulatory Visit (INDEPENDENT_AMBULATORY_CARE_PROVIDER_SITE_OTHER): Payer: Medicare Other | Admitting: Rheumatology

## 2017-12-17 ENCOUNTER — Encounter: Payer: Self-pay | Admitting: Rheumatology

## 2017-12-17 VITALS — BP 133/65 | HR 63 | Resp 13 | Ht 61.0 in | Wt 141.8 lb

## 2017-12-17 DIAGNOSIS — Z79899 Other long term (current) drug therapy: Secondary | ICD-10-CM | POA: Diagnosis not present

## 2017-12-17 DIAGNOSIS — M1990 Unspecified osteoarthritis, unspecified site: Secondary | ICD-10-CM

## 2017-12-17 DIAGNOSIS — Z96651 Presence of right artificial knee joint: Secondary | ICD-10-CM | POA: Diagnosis not present

## 2017-12-17 DIAGNOSIS — Z853 Personal history of malignant neoplasm of breast: Secondary | ICD-10-CM

## 2017-12-17 DIAGNOSIS — M19042 Primary osteoarthritis, left hand: Secondary | ICD-10-CM

## 2017-12-17 DIAGNOSIS — M19072 Primary osteoarthritis, left ankle and foot: Secondary | ICD-10-CM | POA: Diagnosis not present

## 2017-12-17 DIAGNOSIS — M1712 Unilateral primary osteoarthritis, left knee: Secondary | ICD-10-CM | POA: Diagnosis not present

## 2017-12-17 DIAGNOSIS — M19041 Primary osteoarthritis, right hand: Secondary | ICD-10-CM

## 2017-12-17 DIAGNOSIS — Z8679 Personal history of other diseases of the circulatory system: Secondary | ICD-10-CM | POA: Diagnosis not present

## 2017-12-17 DIAGNOSIS — M19071 Primary osteoarthritis, right ankle and foot: Secondary | ICD-10-CM

## 2017-12-21 LAB — CYCLIC CITRUL PEPTIDE ANTIBODY, IGG

## 2017-12-21 LAB — RHEUMATOID FACTOR: Rhuematoid fact SerPl-aCnc: <14 IU/mL (ref <14)

## 2017-12-21 LAB — 14-3-3 ETA PROTEIN: 14-3-3 eta Protein: <0.2 ng/mL (ref <0.2)

## 2017-12-22 NOTE — Progress Notes (Signed)
Labs are WNL.

## 2018-01-17 DIAGNOSIS — J22 Unspecified acute lower respiratory infection: Secondary | ICD-10-CM | POA: Diagnosis not present

## 2018-01-23 ENCOUNTER — Other Ambulatory Visit: Payer: Self-pay

## 2018-03-10 DIAGNOSIS — F330 Major depressive disorder, recurrent, mild: Secondary | ICD-10-CM | POA: Diagnosis not present

## 2018-03-10 DIAGNOSIS — N183 Chronic kidney disease, stage 3 (moderate): Secondary | ICD-10-CM | POA: Diagnosis not present

## 2018-03-10 DIAGNOSIS — K219 Gastro-esophageal reflux disease without esophagitis: Secondary | ICD-10-CM | POA: Diagnosis not present

## 2018-03-10 DIAGNOSIS — I10 Essential (primary) hypertension: Secondary | ICD-10-CM | POA: Diagnosis not present

## 2018-03-10 DIAGNOSIS — Z1389 Encounter for screening for other disorder: Secondary | ICD-10-CM | POA: Diagnosis not present

## 2018-03-10 DIAGNOSIS — E7800 Pure hypercholesterolemia, unspecified: Secondary | ICD-10-CM | POA: Diagnosis not present

## 2018-03-10 DIAGNOSIS — M8580 Other specified disorders of bone density and structure, unspecified site: Secondary | ICD-10-CM | POA: Diagnosis not present

## 2018-03-10 DIAGNOSIS — F411 Generalized anxiety disorder: Secondary | ICD-10-CM | POA: Diagnosis not present

## 2018-03-10 DIAGNOSIS — M1990 Unspecified osteoarthritis, unspecified site: Secondary | ICD-10-CM | POA: Diagnosis not present

## 2018-03-10 DIAGNOSIS — M069 Rheumatoid arthritis, unspecified: Secondary | ICD-10-CM | POA: Diagnosis not present

## 2018-03-10 DIAGNOSIS — E039 Hypothyroidism, unspecified: Secondary | ICD-10-CM | POA: Diagnosis not present

## 2018-05-06 NOTE — Progress Notes (Deleted)
Office Visit Note  Patient: Marilyn Richard             Date of Birth: [birth year]           MRN: [id number]             PCP: Townsend Roger, MD Referring: Townsend Roger, MD Visit Date: 05/20/2018 Occupation: @GUAROCC @  Subjective:  No chief complaint on file.   History of Present Illness: Marilyn Richard is a 85 y.o. female ***   Activities of Daily Living:  Patient reports morning stiffness for *** {minute/hour:19697}.   Patient {ACTIONS;DENIES/REPORTS:21021675::"Denies"} nocturnal pain.  Difficulty dressing/grooming: {ACTIONS;DENIES/REPORTS:21021675::"Denies"} Difficulty climbing stairs: {ACTIONS;DENIES/REPORTS:21021675::"Denies"} Difficulty getting out of chair: {ACTIONS;DENIES/REPORTS:21021675::"Denies"} Difficulty using hands for taps, buttons, cutlery, and/or writing: {ACTIONS;DENIES/REPORTS:21021675::"Denies"}  No Rheumatology ROS completed.   PMFS History:  Patient Active Problem List   Diagnosis Date Noted  . High risk medication use 04/10/2016    Past Medical History:  Diagnosis Date  . Breast cancer ([place])   . Hypertension   . Hypothyroidism   . Inflammatory arthritis   . Osteoarthritis     Family History  Problem Relation Age of Onset  . Cancer Mother   . Cancer Father   . Cancer Sister    Past Surgical History:  Procedure Laterality Date  . ABDOMINAL HYSTERECTOMY    . BREAST SURGERY Left    Lumpectomy   . CHOLECYSTECTOMY    . CYSTOCELE REPAIR    . KNEE ARTHROPLASTY    . RECTOCELE REPAIR    . REPLACEMENT TOTAL KNEE Right   . TUBAL LIGATION     Social History   Social History Narrative  . Not on file    There is no immunization history on file for this patient.   Objective: Vital Signs: There were no vitals taken for this visit.   Physical Exam   Musculoskeletal Exam: ***  CDAI Exam: CDAI Score: Not documented Patient Global Assessment: Not documented; Provider Global Assessment: Not documented Swollen: Not documented;  Tender: Not documented Joint Exam   Not documented   There is currently no information documented on the homunculus. Go to the Rheumatology activity and complete the homunculus joint exam.  Investigation: No additional findings.  Imaging: No results found.  Recent Labs: Lab Results  Component Value Date   WBC 6.2 10/30/2017   HGB 12.4 10/30/2017   PLT 214 10/30/2017   NA 136 10/30/2017   K 4.4 10/30/2017   CL 100 10/30/2017   CO2 25 10/30/2017   GLUCOSE 86 10/30/2017   BUN 22 10/30/2017   CREATININE 1.03 (H) 10/30/2017   BILITOT 0.4 10/30/2017   ALKPHOS 31 (L) 08/22/2016   AST 17 10/30/2017   ALT 13 10/30/2017   PROT 6.0 (L) 10/30/2017   ALBUMIN 4.0 08/22/2016   CALCIUM 9.4 10/30/2017   GFRAA 56 (L) 10/30/2017    Speciality Comments: PLQ eye exam: normal on 10/28/16 via [facility]  Procedures:  No procedures performed Allergies: Wasp venom; Keflex [cephalexin]; Levaquin [levofloxacin in d5w]; and Lorabid [loracarbef]   Assessment / Plan:     Visit Diagnoses: No diagnosis found.   Orders: No orders of the defined types were placed in this encounter.  No orders of the defined types were placed in this encounter.   Face-to-face time spent with patient was *** minutes. Greater than 50% of time was spent in counseling and coordination of care.  Follow-Up Instructions: No follow-ups on file.   Earnestine Mealing, CMA  Note - This record has been created using [employer].  Chart creation errors have been sought, but may not always  have been located. Such creation errors do not reflect on  the standard of medical care.

## 2018-05-13 DIAGNOSIS — L821 Other seborrheic keratosis: Secondary | ICD-10-CM | POA: Diagnosis not present

## 2018-05-13 DIAGNOSIS — Z79899 Other long term (current) drug therapy: Secondary | ICD-10-CM | POA: Diagnosis not present

## 2018-05-13 DIAGNOSIS — L570 Actinic keratosis: Secondary | ICD-10-CM | POA: Diagnosis not present

## 2018-05-13 DIAGNOSIS — L578 Other skin changes due to chronic exposure to nonionizing radiation: Secondary | ICD-10-CM | POA: Diagnosis not present

## 2018-05-13 DIAGNOSIS — H35363 Drusen (degenerative) of macula, bilateral: Secondary | ICD-10-CM | POA: Diagnosis not present

## 2018-05-20 ENCOUNTER — Ambulatory Visit: Payer: Medicare Other | Admitting: Rheumatology

## 2018-05-20 DIAGNOSIS — R05 Cough: Secondary | ICD-10-CM | POA: Diagnosis not present

## 2018-05-20 DIAGNOSIS — J209 Acute bronchitis, unspecified: Secondary | ICD-10-CM | POA: Diagnosis not present

## 2018-06-10 ENCOUNTER — Ambulatory Visit: Payer: Medicare Other | Admitting: Rheumatology

## 2018-06-12 DIAGNOSIS — R05 Cough: Secondary | ICD-10-CM | POA: Diagnosis not present

## 2018-06-26 DIAGNOSIS — R05 Cough: Secondary | ICD-10-CM | POA: Diagnosis not present

## 2018-06-26 DIAGNOSIS — R911 Solitary pulmonary nodule: Secondary | ICD-10-CM | POA: Diagnosis not present

## 2018-07-29 DIAGNOSIS — R918 Other nonspecific abnormal finding of lung field: Secondary | ICD-10-CM | POA: Diagnosis not present

## 2018-07-29 DIAGNOSIS — R911 Solitary pulmonary nodule: Secondary | ICD-10-CM | POA: Diagnosis not present

## 2018-08-07 ENCOUNTER — Other Ambulatory Visit (HOSPITAL_COMMUNITY): Payer: Self-pay | Admitting: Internal Medicine

## 2018-08-07 ENCOUNTER — Other Ambulatory Visit: Payer: Self-pay | Admitting: Internal Medicine

## 2018-08-07 DIAGNOSIS — R918 Other nonspecific abnormal finding of lung field: Secondary | ICD-10-CM

## 2018-08-07 DIAGNOSIS — C50919 Malignant neoplasm of unspecified site of unspecified female breast: Secondary | ICD-10-CM | POA: Diagnosis not present

## 2018-08-14 ENCOUNTER — Other Ambulatory Visit: Payer: Self-pay

## 2018-08-14 ENCOUNTER — Ambulatory Visit (HOSPITAL_COMMUNITY)
Admission: RE | Admit: 2018-08-14 | Discharge: 2018-08-14 | Disposition: A | Discharge status: Home / Self Care | Payer: Medicare Other | Source: Ambulatory Visit | Attending: Internal Medicine | Admitting: Internal Medicine

## 2018-08-14 DIAGNOSIS — R918 Other nonspecific abnormal finding of lung field: Secondary | ICD-10-CM

## 2018-08-14 DIAGNOSIS — C7951 Secondary malignant neoplasm of bone: Secondary | ICD-10-CM | POA: Diagnosis not present

## 2018-08-14 DIAGNOSIS — C801 Malignant (primary) neoplasm, unspecified: Secondary | ICD-10-CM | POA: Diagnosis not present

## 2018-08-14 LAB — GLUCOSE, CAPILLARY: Glucose-Capillary: 83 mg/dL (ref 70–99)

## 2018-08-14 MED ORDER — FLUDEOXYGLUCOSE F - 18 (FDG) INJECTION
7.4300 | Freq: Once | INTRAVENOUS | Status: AC | PRN
  Administered 2018-08-14: 7.43 via INTRAVENOUS

## 2018-08-19 DIAGNOSIS — R918 Other nonspecific abnormal finding of lung field: Secondary | ICD-10-CM | POA: Diagnosis not present

## 2018-08-19 DIAGNOSIS — J4520 Mild intermittent asthma, uncomplicated: Secondary | ICD-10-CM | POA: Diagnosis not present

## 2018-08-20 DIAGNOSIS — R590 Localized enlarged lymph nodes: Secondary | ICD-10-CM | POA: Diagnosis not present

## 2018-08-20 DIAGNOSIS — Z853 Personal history of malignant neoplasm of breast: Secondary | ICD-10-CM | POA: Diagnosis not present

## 2018-08-20 DIAGNOSIS — R918 Other nonspecific abnormal finding of lung field: Secondary | ICD-10-CM | POA: Diagnosis not present

## 2018-08-27 DIAGNOSIS — R918 Other nonspecific abnormal finding of lung field: Secondary | ICD-10-CM | POA: Diagnosis not present

## 2018-08-27 DIAGNOSIS — J4530 Mild persistent asthma, uncomplicated: Secondary | ICD-10-CM | POA: Diagnosis not present

## 2018-08-27 DIAGNOSIS — E039 Hypothyroidism, unspecified: Secondary | ICD-10-CM | POA: Diagnosis not present

## 2018-08-27 DIAGNOSIS — E785 Hyperlipidemia, unspecified: Secondary | ICD-10-CM | POA: Diagnosis not present

## 2018-08-27 DIAGNOSIS — Z9889 Other specified postprocedural states: Secondary | ICD-10-CM | POA: Diagnosis not present

## 2018-08-27 DIAGNOSIS — R0602 Shortness of breath: Secondary | ICD-10-CM | POA: Diagnosis not present

## 2018-08-27 DIAGNOSIS — C342 Malignant neoplasm of middle lobe, bronchus or lung: Secondary | ICD-10-CM | POA: Diagnosis not present

## 2018-08-27 DIAGNOSIS — F329 Major depressive disorder, single episode, unspecified: Secondary | ICD-10-CM | POA: Diagnosis not present

## 2018-08-27 DIAGNOSIS — K219 Gastro-esophageal reflux disease without esophagitis: Secondary | ICD-10-CM | POA: Diagnosis not present

## 2018-08-27 DIAGNOSIS — C3491 Malignant neoplasm of unspecified part of right bronchus or lung: Secondary | ICD-10-CM | POA: Insufficient documentation

## 2018-08-27 DIAGNOSIS — Z01818 Encounter for other preprocedural examination: Secondary | ICD-10-CM | POA: Diagnosis not present

## 2018-09-01 DIAGNOSIS — R918 Other nonspecific abnormal finding of lung field: Secondary | ICD-10-CM | POA: Diagnosis not present

## 2018-09-01 DIAGNOSIS — J4520 Mild intermittent asthma, uncomplicated: Secondary | ICD-10-CM | POA: Diagnosis not present

## 2018-09-09 DIAGNOSIS — R978 Other abnormal tumor markers: Secondary | ICD-10-CM | POA: Diagnosis not present

## 2018-09-09 DIAGNOSIS — Z853 Personal history of malignant neoplasm of breast: Secondary | ICD-10-CM | POA: Diagnosis not present

## 2018-09-09 DIAGNOSIS — C50312 Malignant neoplasm of lower-inner quadrant of left female breast: Secondary | ICD-10-CM | POA: Diagnosis not present

## 2018-09-09 DIAGNOSIS — Z79810 Long term (current) use of selective estrogen receptor modulators (SERMs): Secondary | ICD-10-CM | POA: Diagnosis not present

## 2018-09-15 DIAGNOSIS — C342 Malignant neoplasm of middle lobe, bronchus or lung: Secondary | ICD-10-CM | POA: Diagnosis not present

## 2018-09-21 DIAGNOSIS — I351 Nonrheumatic aortic (valve) insufficiency: Secondary | ICD-10-CM | POA: Diagnosis not present

## 2018-09-21 DIAGNOSIS — T451X1A Poisoning by antineoplastic and immunosuppressive drugs, accidental (unintentional), initial encounter: Secondary | ICD-10-CM | POA: Diagnosis not present

## 2018-09-21 DIAGNOSIS — J9811 Atelectasis: Secondary | ICD-10-CM | POA: Diagnosis not present

## 2018-09-21 DIAGNOSIS — C342 Malignant neoplasm of middle lobe, bronchus or lung: Secondary | ICD-10-CM | POA: Diagnosis not present

## 2018-09-21 DIAGNOSIS — R970 Elevated carcinoembryonic antigen [CEA]: Secondary | ICD-10-CM | POA: Diagnosis not present

## 2018-09-21 DIAGNOSIS — J90 Pleural effusion, not elsewhere classified: Secondary | ICD-10-CM | POA: Diagnosis not present

## 2018-09-22 DIAGNOSIS — C3490 Malignant neoplasm of unspecified part of unspecified bronchus or lung: Secondary | ICD-10-CM | POA: Diagnosis not present

## 2018-09-22 DIAGNOSIS — R0600 Dyspnea, unspecified: Secondary | ICD-10-CM | POA: Diagnosis not present

## 2018-09-28 DIAGNOSIS — C50312 Malignant neoplasm of lower-inner quadrant of left female breast: Secondary | ICD-10-CM

## 2018-09-28 DIAGNOSIS — M8589 Other specified disorders of bone density and structure, multiple sites: Secondary | ICD-10-CM

## 2018-10-12 DIAGNOSIS — Z79810 Long term (current) use of selective estrogen receptor modulators (SERMs): Secondary | ICD-10-CM | POA: Diagnosis not present

## 2018-10-12 DIAGNOSIS — M899 Disorder of bone, unspecified: Secondary | ICD-10-CM | POA: Diagnosis not present

## 2018-10-12 DIAGNOSIS — C50312 Malignant neoplasm of lower-inner quadrant of left female breast: Secondary | ICD-10-CM | POA: Diagnosis not present

## 2018-10-12 DIAGNOSIS — C50819 Malignant neoplasm of overlapping sites of unspecified female breast: Secondary | ICD-10-CM | POA: Diagnosis not present

## 2018-10-12 DIAGNOSIS — E871 Hypo-osmolality and hyponatremia: Secondary | ICD-10-CM | POA: Diagnosis not present

## 2018-10-12 DIAGNOSIS — C342 Malignant neoplasm of middle lobe, bronchus or lung: Secondary | ICD-10-CM | POA: Diagnosis not present

## 2018-10-12 DIAGNOSIS — M8589 Other specified disorders of bone density and structure, multiple sites: Secondary | ICD-10-CM | POA: Diagnosis not present

## 2018-10-26 DIAGNOSIS — D6481 Anemia due to antineoplastic chemotherapy: Secondary | ICD-10-CM | POA: Diagnosis not present

## 2018-10-26 DIAGNOSIS — D72819 Decreased white blood cell count, unspecified: Secondary | ICD-10-CM | POA: Diagnosis not present

## 2018-10-26 DIAGNOSIS — R197 Diarrhea, unspecified: Secondary | ICD-10-CM | POA: Diagnosis not present

## 2018-10-26 DIAGNOSIS — E871 Hypo-osmolality and hyponatremia: Secondary | ICD-10-CM | POA: Diagnosis not present

## 2018-10-26 DIAGNOSIS — R970 Elevated carcinoembryonic antigen [CEA]: Secondary | ICD-10-CM | POA: Diagnosis not present

## 2018-10-26 DIAGNOSIS — Z79810 Long term (current) use of selective estrogen receptor modulators (SERMs): Secondary | ICD-10-CM | POA: Diagnosis not present

## 2018-10-26 DIAGNOSIS — Z853 Personal history of malignant neoplasm of breast: Secondary | ICD-10-CM | POA: Diagnosis not present

## 2018-10-26 DIAGNOSIS — C3481 Malignant neoplasm of overlapping sites of right bronchus and lung: Secondary | ICD-10-CM | POA: Diagnosis not present

## 2018-11-04 DIAGNOSIS — C50312 Malignant neoplasm of lower-inner quadrant of left female breast: Secondary | ICD-10-CM | POA: Diagnosis not present

## 2018-11-04 DIAGNOSIS — E871 Hypo-osmolality and hyponatremia: Secondary | ICD-10-CM | POA: Diagnosis not present

## 2018-11-04 DIAGNOSIS — D72819 Decreased white blood cell count, unspecified: Secondary | ICD-10-CM | POA: Diagnosis not present

## 2018-11-04 DIAGNOSIS — C3411 Malignant neoplasm of upper lobe, right bronchus or lung: Secondary | ICD-10-CM | POA: Diagnosis not present

## 2018-11-04 DIAGNOSIS — D6481 Anemia due to antineoplastic chemotherapy: Secondary | ICD-10-CM | POA: Diagnosis not present

## 2018-11-04 DIAGNOSIS — M8589 Other specified disorders of bone density and structure, multiple sites: Secondary | ICD-10-CM | POA: Diagnosis not present

## 2018-11-05 DIAGNOSIS — Z853 Personal history of malignant neoplasm of breast: Secondary | ICD-10-CM | POA: Diagnosis not present

## 2018-11-05 DIAGNOSIS — Z79810 Long term (current) use of selective estrogen receptor modulators (SERMs): Secondary | ICD-10-CM | POA: Diagnosis not present

## 2018-11-11 DIAGNOSIS — Z853 Personal history of malignant neoplasm of breast: Secondary | ICD-10-CM | POA: Diagnosis not present

## 2018-11-11 DIAGNOSIS — J90 Pleural effusion, not elsewhere classified: Secondary | ICD-10-CM | POA: Diagnosis not present

## 2018-11-11 DIAGNOSIS — E876 Hypokalemia: Secondary | ICD-10-CM | POA: Diagnosis not present

## 2018-11-11 DIAGNOSIS — C3490 Malignant neoplasm of unspecified part of unspecified bronchus or lung: Secondary | ICD-10-CM | POA: Diagnosis not present

## 2018-11-11 DIAGNOSIS — C3481 Malignant neoplasm of overlapping sites of right bronchus and lung: Secondary | ICD-10-CM | POA: Diagnosis not present

## 2018-11-11 DIAGNOSIS — Z79810 Long term (current) use of selective estrogen receptor modulators (SERMs): Secondary | ICD-10-CM | POA: Diagnosis not present

## 2018-11-11 DIAGNOSIS — E871 Hypo-osmolality and hyponatremia: Secondary | ICD-10-CM | POA: Diagnosis not present

## 2018-11-17 DIAGNOSIS — M8589 Other specified disorders of bone density and structure, multiple sites: Secondary | ICD-10-CM | POA: Diagnosis not present

## 2018-11-17 DIAGNOSIS — C50312 Malignant neoplasm of lower-inner quadrant of left female breast: Secondary | ICD-10-CM | POA: Diagnosis not present

## 2018-11-25 DIAGNOSIS — C50819 Malignant neoplasm of overlapping sites of unspecified female breast: Secondary | ICD-10-CM | POA: Diagnosis not present

## 2018-11-25 DIAGNOSIS — E871 Hypo-osmolality and hyponatremia: Secondary | ICD-10-CM | POA: Diagnosis not present

## 2018-11-25 DIAGNOSIS — C50312 Malignant neoplasm of lower-inner quadrant of left female breast: Secondary | ICD-10-CM | POA: Diagnosis not present

## 2018-11-25 DIAGNOSIS — Z853 Personal history of malignant neoplasm of breast: Secondary | ICD-10-CM | POA: Diagnosis not present

## 2018-11-25 DIAGNOSIS — C342 Malignant neoplasm of middle lobe, bronchus or lung: Secondary | ICD-10-CM | POA: Diagnosis not present

## 2018-11-25 DIAGNOSIS — M899 Disorder of bone, unspecified: Secondary | ICD-10-CM | POA: Diagnosis not present

## 2018-11-25 DIAGNOSIS — M8589 Other specified disorders of bone density and structure, multiple sites: Secondary | ICD-10-CM | POA: Diagnosis not present

## 2018-11-25 DIAGNOSIS — R970 Elevated carcinoembryonic antigen [CEA]: Secondary | ICD-10-CM | POA: Diagnosis not present

## 2018-12-09 DIAGNOSIS — Z23 Encounter for immunization: Secondary | ICD-10-CM | POA: Diagnosis not present

## 2018-12-09 DIAGNOSIS — M8589 Other specified disorders of bone density and structure, multiple sites: Secondary | ICD-10-CM | POA: Diagnosis not present

## 2018-12-09 DIAGNOSIS — J90 Pleural effusion, not elsewhere classified: Secondary | ICD-10-CM | POA: Diagnosis not present

## 2018-12-09 DIAGNOSIS — Z79810 Long term (current) use of selective estrogen receptor modulators (SERMs): Secondary | ICD-10-CM | POA: Diagnosis not present

## 2018-12-09 DIAGNOSIS — E876 Hypokalemia: Secondary | ICD-10-CM | POA: Diagnosis not present

## 2018-12-09 DIAGNOSIS — Z853 Personal history of malignant neoplasm of breast: Secondary | ICD-10-CM | POA: Diagnosis not present

## 2018-12-23 DIAGNOSIS — C3481 Malignant neoplasm of overlapping sites of right bronchus and lung: Secondary | ICD-10-CM | POA: Diagnosis not present

## 2018-12-23 DIAGNOSIS — C50312 Malignant neoplasm of lower-inner quadrant of left female breast: Secondary | ICD-10-CM | POA: Diagnosis not present

## 2018-12-25 DIAGNOSIS — I10 Essential (primary) hypertension: Secondary | ICD-10-CM | POA: Diagnosis not present

## 2019-01-06 DIAGNOSIS — Z853 Personal history of malignant neoplasm of breast: Secondary | ICD-10-CM | POA: Diagnosis not present

## 2019-01-06 DIAGNOSIS — M8589 Other specified disorders of bone density and structure, multiple sites: Secondary | ICD-10-CM | POA: Diagnosis not present

## 2019-01-06 DIAGNOSIS — R21 Rash and other nonspecific skin eruption: Secondary | ICD-10-CM | POA: Diagnosis not present

## 2019-01-06 DIAGNOSIS — C3491 Malignant neoplasm of unspecified part of right bronchus or lung: Secondary | ICD-10-CM | POA: Diagnosis not present

## 2019-01-27 DIAGNOSIS — R21 Rash and other nonspecific skin eruption: Secondary | ICD-10-CM | POA: Diagnosis not present

## 2019-01-27 DIAGNOSIS — G8929 Other chronic pain: Secondary | ICD-10-CM | POA: Diagnosis not present

## 2019-01-27 DIAGNOSIS — Z853 Personal history of malignant neoplasm of breast: Secondary | ICD-10-CM | POA: Diagnosis not present

## 2019-01-27 DIAGNOSIS — Z79810 Long term (current) use of selective estrogen receptor modulators (SERMs): Secondary | ICD-10-CM | POA: Diagnosis not present

## 2019-01-27 DIAGNOSIS — M545 Low back pain: Secondary | ICD-10-CM | POA: Diagnosis not present

## 2019-01-27 DIAGNOSIS — E871 Hypo-osmolality and hyponatremia: Secondary | ICD-10-CM | POA: Diagnosis not present

## 2019-01-27 DIAGNOSIS — J90 Pleural effusion, not elsewhere classified: Secondary | ICD-10-CM | POA: Diagnosis not present

## 2019-02-03 ENCOUNTER — Other Ambulatory Visit: Payer: Self-pay

## 2019-02-09 DIAGNOSIS — M25532 Pain in left wrist: Secondary | ICD-10-CM | POA: Diagnosis not present

## 2019-02-09 DIAGNOSIS — S52592A Other fractures of lower end of left radius, initial encounter for closed fracture: Secondary | ICD-10-CM | POA: Diagnosis not present

## 2019-02-09 DIAGNOSIS — M1812 Unilateral primary osteoarthritis of first carpometacarpal joint, left hand: Secondary | ICD-10-CM | POA: Diagnosis not present

## 2019-02-10 DIAGNOSIS — S52515A Nondisplaced fracture of left radial styloid process, initial encounter for closed fracture: Secondary | ICD-10-CM | POA: Diagnosis not present

## 2019-02-25 DIAGNOSIS — C3491 Malignant neoplasm of unspecified part of right bronchus or lung: Secondary | ICD-10-CM | POA: Diagnosis not present

## 2019-02-25 DIAGNOSIS — M8580 Other specified disorders of bone density and structure, unspecified site: Secondary | ICD-10-CM | POA: Diagnosis not present

## 2019-02-25 DIAGNOSIS — Z853 Personal history of malignant neoplasm of breast: Secondary | ICD-10-CM | POA: Diagnosis not present

## 2019-03-18 DIAGNOSIS — S52515A Nondisplaced fracture of left radial styloid process, initial encounter for closed fracture: Secondary | ICD-10-CM | POA: Diagnosis not present

## 2019-03-26 DIAGNOSIS — Z79810 Long term (current) use of selective estrogen receptor modulators (SERMs): Secondary | ICD-10-CM | POA: Diagnosis not present

## 2019-03-26 DIAGNOSIS — Z853 Personal history of malignant neoplasm of breast: Secondary | ICD-10-CM | POA: Diagnosis not present

## 2019-03-26 DIAGNOSIS — M810 Age-related osteoporosis without current pathological fracture: Secondary | ICD-10-CM | POA: Diagnosis not present

## 2019-03-26 DIAGNOSIS — C3411 Malignant neoplasm of upper lobe, right bronchus or lung: Secondary | ICD-10-CM | POA: Diagnosis not present

## 2019-03-26 DIAGNOSIS — C342 Malignant neoplasm of middle lobe, bronchus or lung: Secondary | ICD-10-CM | POA: Diagnosis not present

## 2019-04-30 DIAGNOSIS — C50312 Malignant neoplasm of lower-inner quadrant of left female breast: Secondary | ICD-10-CM | POA: Diagnosis not present

## 2019-04-30 DIAGNOSIS — C342 Malignant neoplasm of middle lobe, bronchus or lung: Secondary | ICD-10-CM | POA: Diagnosis not present

## 2019-04-30 DIAGNOSIS — Z853 Personal history of malignant neoplasm of breast: Secondary | ICD-10-CM | POA: Diagnosis not present

## 2019-04-30 DIAGNOSIS — R978 Other abnormal tumor markers: Secondary | ICD-10-CM | POA: Diagnosis not present

## 2019-05-13 DIAGNOSIS — Z1389 Encounter for screening for other disorder: Secondary | ICD-10-CM | POA: Diagnosis not present

## 2019-05-13 DIAGNOSIS — I10 Essential (primary) hypertension: Secondary | ICD-10-CM | POA: Diagnosis not present

## 2019-05-13 DIAGNOSIS — M06049 Rheumatoid arthritis without rheumatoid factor, unspecified hand: Secondary | ICD-10-CM | POA: Diagnosis not present

## 2019-05-13 DIAGNOSIS — Z853 Personal history of malignant neoplasm of breast: Secondary | ICD-10-CM | POA: Diagnosis not present

## 2019-05-13 DIAGNOSIS — F411 Generalized anxiety disorder: Secondary | ICD-10-CM | POA: Diagnosis not present

## 2019-05-13 DIAGNOSIS — C342 Malignant neoplasm of middle lobe, bronchus or lung: Secondary | ICD-10-CM | POA: Diagnosis not present

## 2019-05-13 DIAGNOSIS — N1832 Chronic kidney disease, stage 3b: Secondary | ICD-10-CM | POA: Diagnosis not present

## 2019-05-13 DIAGNOSIS — Z6824 Body mass index (BMI) 24.0-24.9, adult: Secondary | ICD-10-CM | POA: Diagnosis not present

## 2019-05-13 DIAGNOSIS — M8580 Other specified disorders of bone density and structure, unspecified site: Secondary | ICD-10-CM | POA: Diagnosis not present

## 2019-05-13 DIAGNOSIS — E7800 Pure hypercholesterolemia, unspecified: Secondary | ICD-10-CM | POA: Diagnosis not present

## 2019-05-13 DIAGNOSIS — M1990 Unspecified osteoarthritis, unspecified site: Secondary | ICD-10-CM | POA: Diagnosis not present

## 2019-05-13 DIAGNOSIS — E039 Hypothyroidism, unspecified: Secondary | ICD-10-CM | POA: Diagnosis not present

## 2019-05-28 DIAGNOSIS — C342 Malignant neoplasm of middle lobe, bronchus or lung: Secondary | ICD-10-CM | POA: Diagnosis not present

## 2019-05-28 DIAGNOSIS — R978 Other abnormal tumor markers: Secondary | ICD-10-CM | POA: Diagnosis not present

## 2019-05-28 DIAGNOSIS — Z853 Personal history of malignant neoplasm of breast: Secondary | ICD-10-CM | POA: Diagnosis not present

## 2019-05-28 DIAGNOSIS — Z79811 Long term (current) use of aromatase inhibitors: Secondary | ICD-10-CM | POA: Diagnosis not present

## 2019-06-17 DIAGNOSIS — Z853 Personal history of malignant neoplasm of breast: Secondary | ICD-10-CM | POA: Diagnosis not present

## 2019-06-17 DIAGNOSIS — R922 Inconclusive mammogram: Secondary | ICD-10-CM | POA: Diagnosis not present

## 2019-06-24 DIAGNOSIS — I700 Atherosclerosis of aorta: Secondary | ICD-10-CM | POA: Diagnosis not present

## 2019-06-24 DIAGNOSIS — C50312 Malignant neoplasm of lower-inner quadrant of left female breast: Secondary | ICD-10-CM | POA: Diagnosis not present

## 2019-06-24 DIAGNOSIS — C50912 Malignant neoplasm of unspecified site of left female breast: Secondary | ICD-10-CM | POA: Diagnosis not present

## 2019-06-24 DIAGNOSIS — C342 Malignant neoplasm of middle lobe, bronchus or lung: Secondary | ICD-10-CM | POA: Diagnosis not present

## 2019-06-25 DIAGNOSIS — J90 Pleural effusion, not elsewhere classified: Secondary | ICD-10-CM | POA: Diagnosis not present

## 2019-06-25 DIAGNOSIS — Z515 Encounter for palliative care: Secondary | ICD-10-CM | POA: Diagnosis not present

## 2019-06-25 DIAGNOSIS — C342 Malignant neoplasm of middle lobe, bronchus or lung: Secondary | ICD-10-CM | POA: Diagnosis not present

## 2019-06-25 DIAGNOSIS — Z79811 Long term (current) use of aromatase inhibitors: Secondary | ICD-10-CM | POA: Diagnosis not present

## 2019-06-25 DIAGNOSIS — C50312 Malignant neoplasm of lower-inner quadrant of left female breast: Secondary | ICD-10-CM

## 2019-06-25 DIAGNOSIS — R21 Rash and other nonspecific skin eruption: Secondary | ICD-10-CM | POA: Diagnosis not present

## 2019-06-25 DIAGNOSIS — Z853 Personal history of malignant neoplasm of breast: Secondary | ICD-10-CM | POA: Diagnosis not present

## 2019-06-25 DIAGNOSIS — M8589 Other specified disorders of bone density and structure, multiple sites: Secondary | ICD-10-CM

## 2019-06-25 DIAGNOSIS — C7951 Secondary malignant neoplasm of bone: Secondary | ICD-10-CM | POA: Diagnosis not present

## 2019-06-25 DIAGNOSIS — M545 Low back pain: Secondary | ICD-10-CM | POA: Diagnosis not present

## 2019-06-25 DIAGNOSIS — M8580 Other specified disorders of bone density and structure, unspecified site: Secondary | ICD-10-CM | POA: Diagnosis not present

## 2019-06-25 DIAGNOSIS — E871 Hypo-osmolality and hyponatremia: Secondary | ICD-10-CM | POA: Diagnosis not present

## 2019-06-25 DIAGNOSIS — F329 Major depressive disorder, single episode, unspecified: Secondary | ICD-10-CM | POA: Diagnosis not present

## 2019-06-25 DIAGNOSIS — M25559 Pain in unspecified hip: Secondary | ICD-10-CM | POA: Diagnosis not present

## 2019-06-25 DIAGNOSIS — G8929 Other chronic pain: Secondary | ICD-10-CM | POA: Diagnosis not present

## 2019-07-23 DIAGNOSIS — Z853 Personal history of malignant neoplasm of breast: Secondary | ICD-10-CM | POA: Diagnosis not present

## 2019-07-23 DIAGNOSIS — M545 Low back pain: Secondary | ICD-10-CM | POA: Diagnosis not present

## 2019-07-23 DIAGNOSIS — Z7951 Long term (current) use of inhaled steroids: Secondary | ICD-10-CM | POA: Diagnosis not present

## 2019-07-23 DIAGNOSIS — E871 Hypo-osmolality and hyponatremia: Secondary | ICD-10-CM | POA: Diagnosis not present

## 2019-07-23 DIAGNOSIS — F329 Major depressive disorder, single episode, unspecified: Secondary | ICD-10-CM | POA: Diagnosis not present

## 2019-07-23 DIAGNOSIS — C3481 Malignant neoplasm of overlapping sites of right bronchus and lung: Secondary | ICD-10-CM | POA: Diagnosis not present

## 2019-07-23 DIAGNOSIS — R21 Rash and other nonspecific skin eruption: Secondary | ICD-10-CM | POA: Diagnosis not present

## 2019-08-13 DIAGNOSIS — R05 Cough: Secondary | ICD-10-CM | POA: Diagnosis not present

## 2019-08-13 DIAGNOSIS — C3490 Malignant neoplasm of unspecified part of unspecified bronchus or lung: Secondary | ICD-10-CM | POA: Diagnosis not present

## 2019-08-13 DIAGNOSIS — I10 Essential (primary) hypertension: Secondary | ICD-10-CM | POA: Diagnosis not present

## 2019-08-13 DIAGNOSIS — J302 Other seasonal allergic rhinitis: Secondary | ICD-10-CM | POA: Diagnosis not present

## 2019-08-20 DIAGNOSIS — M899 Disorder of bone, unspecified: Secondary | ICD-10-CM

## 2019-08-20 DIAGNOSIS — G8929 Other chronic pain: Secondary | ICD-10-CM | POA: Diagnosis not present

## 2019-08-20 DIAGNOSIS — C50312 Malignant neoplasm of lower-inner quadrant of left female breast: Secondary | ICD-10-CM

## 2019-08-20 DIAGNOSIS — Z79810 Long term (current) use of selective estrogen receptor modulators (SERMs): Secondary | ICD-10-CM | POA: Diagnosis not present

## 2019-08-20 DIAGNOSIS — Z853 Personal history of malignant neoplasm of breast: Secondary | ICD-10-CM | POA: Diagnosis not present

## 2019-08-20 DIAGNOSIS — C3481 Malignant neoplasm of overlapping sites of right bronchus and lung: Secondary | ICD-10-CM | POA: Diagnosis not present

## 2019-08-20 DIAGNOSIS — J90 Pleural effusion, not elsewhere classified: Secondary | ICD-10-CM | POA: Diagnosis not present

## 2019-08-20 DIAGNOSIS — E871 Hypo-osmolality and hyponatremia: Secondary | ICD-10-CM | POA: Diagnosis not present

## 2019-08-20 DIAGNOSIS — R21 Rash and other nonspecific skin eruption: Secondary | ICD-10-CM | POA: Diagnosis not present

## 2019-08-20 DIAGNOSIS — M545 Low back pain: Secondary | ICD-10-CM | POA: Diagnosis not present

## 2019-08-20 DIAGNOSIS — F329 Major depressive disorder, single episode, unspecified: Secondary | ICD-10-CM | POA: Diagnosis not present

## 2019-08-20 DIAGNOSIS — M8589 Other specified disorders of bone density and structure, multiple sites: Secondary | ICD-10-CM

## 2019-08-23 DIAGNOSIS — M810 Age-related osteoporosis without current pathological fracture: Secondary | ICD-10-CM | POA: Diagnosis not present

## 2019-08-27 DIAGNOSIS — E039 Hypothyroidism, unspecified: Secondary | ICD-10-CM | POA: Diagnosis not present

## 2019-08-27 DIAGNOSIS — S72141A Displaced intertrochanteric fracture of right femur, initial encounter for closed fracture: Secondary | ICD-10-CM | POA: Diagnosis not present

## 2019-08-27 DIAGNOSIS — I10 Essential (primary) hypertension: Secondary | ICD-10-CM

## 2019-08-28 DIAGNOSIS — E039 Hypothyroidism, unspecified: Secondary | ICD-10-CM | POA: Diagnosis not present

## 2019-08-28 DIAGNOSIS — S72141A Displaced intertrochanteric fracture of right femur, initial encounter for closed fracture: Secondary | ICD-10-CM | POA: Diagnosis not present

## 2019-08-28 DIAGNOSIS — I10 Essential (primary) hypertension: Secondary | ICD-10-CM | POA: Diagnosis not present

## 2019-08-29 DIAGNOSIS — I10 Essential (primary) hypertension: Secondary | ICD-10-CM | POA: Diagnosis not present

## 2019-08-29 DIAGNOSIS — E039 Hypothyroidism, unspecified: Secondary | ICD-10-CM | POA: Diagnosis not present

## 2019-08-29 DIAGNOSIS — S72141A Displaced intertrochanteric fracture of right femur, initial encounter for closed fracture: Secondary | ICD-10-CM | POA: Diagnosis not present

## 2019-08-30 DIAGNOSIS — I10 Essential (primary) hypertension: Secondary | ICD-10-CM | POA: Diagnosis not present

## 2019-08-30 DIAGNOSIS — S72141A Displaced intertrochanteric fracture of right femur, initial encounter for closed fracture: Secondary | ICD-10-CM | POA: Diagnosis not present

## 2019-08-30 DIAGNOSIS — E039 Hypothyroidism, unspecified: Secondary | ICD-10-CM | POA: Diagnosis not present

## 2019-08-31 DIAGNOSIS — M2550 Pain in unspecified joint: Secondary | ICD-10-CM | POA: Diagnosis not present

## 2019-08-31 DIAGNOSIS — S79929A Unspecified injury of unspecified thigh, initial encounter: Secondary | ICD-10-CM | POA: Diagnosis not present

## 2019-08-31 DIAGNOSIS — R531 Weakness: Secondary | ICD-10-CM | POA: Diagnosis not present

## 2019-08-31 DIAGNOSIS — E039 Hypothyroidism, unspecified: Secondary | ICD-10-CM | POA: Diagnosis not present

## 2019-08-31 DIAGNOSIS — S72141A Displaced intertrochanteric fracture of right femur, initial encounter for closed fracture: Secondary | ICD-10-CM | POA: Diagnosis not present

## 2019-08-31 DIAGNOSIS — I10 Essential (primary) hypertension: Secondary | ICD-10-CM | POA: Diagnosis not present

## 2019-08-31 DIAGNOSIS — Z7401 Bed confinement status: Secondary | ICD-10-CM | POA: Diagnosis not present

## 2019-08-31 DIAGNOSIS — R404 Transient alteration of awareness: Secondary | ICD-10-CM | POA: Diagnosis not present

## 2019-09-01 DIAGNOSIS — E039 Hypothyroidism, unspecified: Secondary | ICD-10-CM | POA: Diagnosis not present

## 2019-09-01 DIAGNOSIS — S72141A Displaced intertrochanteric fracture of right femur, initial encounter for closed fracture: Secondary | ICD-10-CM | POA: Diagnosis not present

## 2019-09-01 DIAGNOSIS — I10 Essential (primary) hypertension: Secondary | ICD-10-CM | POA: Diagnosis not present

## 2019-09-08 ENCOUNTER — Other Ambulatory Visit: Payer: Self-pay | Admitting: *Deleted

## 2019-09-08 NOTE — Patient Outreach (Signed)
Screened for potential [facility] Care Management needs as a benefit of  [employer].  Marilyn Richard is currently receiving skilled therapy at  [facility].   Writer attended telephonic interdisciplinary team meeting to assess for disposition needs and transition plan for resident.   Member is from home with husband. Facility states member husband says he wants to see how Marilyn Richard does with therapy before discussing transition plans.   Will continue to follow and plan outreach accordingly.   Marthenia Rolling, MSN-Ed, RN,BSN [place] Acute Care Coordinator [phone] [facility]) [phone]  (Toll free office)

## 2019-09-09 DIAGNOSIS — E039 Hypothyroidism, unspecified: Secondary | ICD-10-CM | POA: Diagnosis not present

## 2019-09-09 DIAGNOSIS — R278 Other lack of coordination: Secondary | ICD-10-CM | POA: Diagnosis not present

## 2019-09-09 DIAGNOSIS — R2681 Unsteadiness on feet: Secondary | ICD-10-CM | POA: Diagnosis not present

## 2019-09-09 DIAGNOSIS — F329 Major depressive disorder, single episode, unspecified: Secondary | ICD-10-CM | POA: Diagnosis not present

## 2019-09-09 DIAGNOSIS — E46 Unspecified protein-calorie malnutrition: Secondary | ICD-10-CM | POA: Diagnosis not present

## 2019-09-09 DIAGNOSIS — I10 Essential (primary) hypertension: Secondary | ICD-10-CM | POA: Diagnosis not present

## 2019-09-09 DIAGNOSIS — Z85118 Personal history of other malignant neoplasm of bronchus and lung: Secondary | ICD-10-CM | POA: Diagnosis not present

## 2019-09-09 DIAGNOSIS — M25552 Pain in left hip: Secondary | ICD-10-CM | POA: Diagnosis not present

## 2019-09-09 DIAGNOSIS — G4700 Insomnia, unspecified: Secondary | ICD-10-CM | POA: Diagnosis not present

## 2019-09-09 DIAGNOSIS — M6281 Muscle weakness (generalized): Secondary | ICD-10-CM | POA: Diagnosis not present

## 2019-09-09 DIAGNOSIS — D649 Anemia, unspecified: Secondary | ICD-10-CM | POA: Diagnosis not present

## 2019-09-09 DIAGNOSIS — E871 Hypo-osmolality and hyponatremia: Secondary | ICD-10-CM | POA: Diagnosis not present

## 2019-09-09 DIAGNOSIS — M818 Other osteoporosis without current pathological fracture: Secondary | ICD-10-CM | POA: Diagnosis not present

## 2019-09-09 DIAGNOSIS — F419 Anxiety disorder, unspecified: Secondary | ICD-10-CM | POA: Diagnosis not present

## 2019-09-09 DIAGNOSIS — Z4789 Encounter for other orthopedic aftercare: Secondary | ICD-10-CM | POA: Diagnosis not present

## 2019-09-09 DIAGNOSIS — W19XXXD Unspecified fall, subsequent encounter: Secondary | ICD-10-CM | POA: Diagnosis not present

## 2019-09-09 DIAGNOSIS — S72141D Displaced intertrochanteric fracture of right femur, subsequent encounter for closed fracture with routine healing: Secondary | ICD-10-CM | POA: Diagnosis not present

## 2019-09-09 DIAGNOSIS — Z9181 History of falling: Secondary | ICD-10-CM | POA: Diagnosis not present

## 2019-09-09 DIAGNOSIS — E785 Hyperlipidemia, unspecified: Secondary | ICD-10-CM | POA: Diagnosis not present

## 2019-09-15 ENCOUNTER — Other Ambulatory Visit: Payer: Self-pay | Admitting: *Deleted

## 2019-09-15 NOTE — Patient Outreach (Signed)
Screened for potential [facility] Care Management needs as a benefit of  [employer].  Mrs. Crume is receiving skilled therapy at [facility].  Writer attended telephonic interdisciplinary team meeting to assess for disposition needs and transition plan for resident.   Facility reports member is progressing well with therapy. Facility to speak with son today to discuss transition plan. Member lived alone prior.   Will plan outreach to [facility] son as appropriate.    Marthenia Rolling, MSN-Ed, RN,BSN [place] Acute Care Coordinator [phone] [facility]) [phone]  (Toll free office)

## 2019-09-22 ENCOUNTER — Other Ambulatory Visit: Payer: Self-pay | Admitting: *Deleted

## 2019-09-22 DIAGNOSIS — I10 Essential (primary) hypertension: Secondary | ICD-10-CM

## 2019-09-22 NOTE — Patient Outreach (Signed)
Member screened for potential [facility] Care Management needs as a benefit of [place] Medicare.  Mrs. Vanasten is receiving skilled therapy at [facility]. Facility SW reports member will transition home this week.   Telephone call made to daughter in law on file Corisa Montini [phone]. Patient identifiers.  [name] reports member will return home on this Friday 09/24/19. Member lives alone. However, [name] states she lives next door to Mrs. Eckersley. [name] states she provides meals and transports member to MD appointments. Encouraged [name] to make f/u appointment with Dr. Nona Dell for post SNF visit. [name] states member has follow up ortho appt on 10/07/19. States member is undergoing treatment for lung cancer as well. Mrs. Hodder takes oral chemotherapy.   [name] states Mrs. Binford is very "spunky." She was independent prior and is doing really well in rehab.   Explained [place] Management services. Discussed [facility] Care Management will not interfere or replace home health.   Will make referral for [facility] for care coordination.   Mrs. Hilligoss has history of left breast cancer, s/p lumpectomy, stage IV lung cancer, HTN, depression, anemia, s/p right ORIF.   Will verify home health agency with facility Morgan, MSN-Ed, RN,BSN [place] Acute Care Coordinator [phone] [facility]) [phone]  (Toll free office)

## 2019-09-25 DIAGNOSIS — I129 Hypertensive chronic kidney disease with stage 1 through stage 4 chronic kidney disease, or unspecified chronic kidney disease: Secondary | ICD-10-CM | POA: Diagnosis not present

## 2019-09-25 DIAGNOSIS — C342 Malignant neoplasm of middle lobe, bronchus or lung: Secondary | ICD-10-CM | POA: Diagnosis not present

## 2019-09-25 DIAGNOSIS — D6481 Anemia due to antineoplastic chemotherapy: Secondary | ICD-10-CM | POA: Diagnosis not present

## 2019-09-25 DIAGNOSIS — D631 Anemia in chronic kidney disease: Secondary | ICD-10-CM | POA: Diagnosis not present

## 2019-09-25 DIAGNOSIS — Z170 Estrogen receptor positive status [ER+]: Secondary | ICD-10-CM | POA: Diagnosis not present

## 2019-09-25 DIAGNOSIS — C7801 Secondary malignant neoplasm of right lung: Secondary | ICD-10-CM | POA: Diagnosis not present

## 2019-09-25 DIAGNOSIS — Z79810 Long term (current) use of selective estrogen receptor modulators (SERMs): Secondary | ICD-10-CM | POA: Diagnosis not present

## 2019-09-25 DIAGNOSIS — E46 Unspecified protein-calorie malnutrition: Secondary | ICD-10-CM | POA: Diagnosis not present

## 2019-09-25 DIAGNOSIS — E039 Hypothyroidism, unspecified: Secondary | ICD-10-CM | POA: Diagnosis not present

## 2019-09-25 DIAGNOSIS — F329 Major depressive disorder, single episode, unspecified: Secondary | ICD-10-CM | POA: Diagnosis not present

## 2019-09-25 DIAGNOSIS — T451X5A Adverse effect of antineoplastic and immunosuppressive drugs, initial encounter: Secondary | ICD-10-CM | POA: Diagnosis not present

## 2019-09-25 DIAGNOSIS — Z9181 History of falling: Secondary | ICD-10-CM | POA: Diagnosis not present

## 2019-09-25 DIAGNOSIS — Z96651 Presence of right artificial knee joint: Secondary | ICD-10-CM | POA: Diagnosis not present

## 2019-09-25 DIAGNOSIS — C7951 Secondary malignant neoplasm of bone: Secondary | ICD-10-CM | POA: Diagnosis not present

## 2019-09-25 DIAGNOSIS — C782 Secondary malignant neoplasm of pleura: Secondary | ICD-10-CM | POA: Diagnosis not present

## 2019-09-25 DIAGNOSIS — M810 Age-related osteoporosis without current pathological fracture: Secondary | ICD-10-CM | POA: Diagnosis not present

## 2019-09-25 DIAGNOSIS — S72141D Displaced intertrochanteric fracture of right femur, subsequent encounter for closed fracture with routine healing: Secondary | ICD-10-CM | POA: Diagnosis not present

## 2019-09-25 DIAGNOSIS — G4700 Insomnia, unspecified: Secondary | ICD-10-CM | POA: Diagnosis not present

## 2019-09-25 DIAGNOSIS — Z79899 Other long term (current) drug therapy: Secondary | ICD-10-CM | POA: Diagnosis not present

## 2019-09-25 DIAGNOSIS — Z792 Long term (current) use of antibiotics: Secondary | ICD-10-CM | POA: Diagnosis not present

## 2019-09-25 DIAGNOSIS — F419 Anxiety disorder, unspecified: Secondary | ICD-10-CM | POA: Diagnosis not present

## 2019-09-25 DIAGNOSIS — N189 Chronic kidney disease, unspecified: Secondary | ICD-10-CM | POA: Diagnosis not present

## 2019-09-25 DIAGNOSIS — C50912 Malignant neoplasm of unspecified site of left female breast: Secondary | ICD-10-CM | POA: Diagnosis not present

## 2019-09-27 ENCOUNTER — Other Ambulatory Visit: Payer: Self-pay | Admitting: *Deleted

## 2019-09-27 ENCOUNTER — Encounter: Payer: Self-pay | Admitting: *Deleted

## 2019-09-27 DIAGNOSIS — S72141D Displaced intertrochanteric fracture of right femur, subsequent encounter for closed fracture with routine healing: Secondary | ICD-10-CM | POA: Diagnosis not present

## 2019-09-27 DIAGNOSIS — N189 Chronic kidney disease, unspecified: Secondary | ICD-10-CM | POA: Diagnosis not present

## 2019-09-27 DIAGNOSIS — M810 Age-related osteoporosis without current pathological fracture: Secondary | ICD-10-CM | POA: Diagnosis not present

## 2019-09-27 DIAGNOSIS — I129 Hypertensive chronic kidney disease with stage 1 through stage 4 chronic kidney disease, or unspecified chronic kidney disease: Secondary | ICD-10-CM | POA: Diagnosis not present

## 2019-09-27 DIAGNOSIS — D631 Anemia in chronic kidney disease: Secondary | ICD-10-CM | POA: Diagnosis not present

## 2019-09-27 DIAGNOSIS — F329 Major depressive disorder, single episode, unspecified: Secondary | ICD-10-CM | POA: Diagnosis not present

## 2019-09-27 NOTE — Patient Outreach (Addendum)
[facility]) Care Management  09/27/2019  Marilyn Richard Marilyn Richard [birth year] [id number]   Transition of care-Enrolled-Fall Prevention Please call Marilyn Richard daughter-in-law for updates [phone]  RN spoke with pt briefly while awaiting HHPT. Explained [facility] services and the purpose for today's. Pt receptive however requested RN to contact her daughter-in-law Marilyn Richard for further inquires concerning her health. Pt provided the contact number to call.  RN outreach to the daughter in law Marilyn Richard and discussed the purpose for today's call services [facility].  Discuss all possible needs and involvement with HHPT post discharged from the SNF. Pt lives along however has plenty of assistance with family as her neighbors. Daughter-in-law works from home and ready available if pt needs anything and for closer monitoring.  Receptive to [facility] services and agreed to enroll pt into the Fall prevention program and services. Aware [facility] has Education officer, museum and pharmacy if pt has needs in this area. Will discuss and generate a plan of care and offer any needed resources.   Plan: Will notify pt's provider of pt's disposition with [facility] services and daughter has agreed to monthly follow up call for ongoing case management needs. Will strongly encouraged caregiver to reiterate on the plan of care discussed today and fell free to contact RN case manager or any needed resources. Caregiver agreed and receptive to all discussed.  Goals Addressed            This Visit's Progress   . to get back to my routine       CARE PLAN ENTRY (see longitudinal plan of care for additional care plan information)  Current Barriers:  Marland Kitchen Knowledge Deficits related to fall precautions . Decreased adherence to prescribed treatment for fall prevention . Knowledge Deficits related to prevention measures (falls)  Clinical Goal(s):  Marland Kitchen Over the next 45 days, patient will demonstrate improved adherence to  prescribed treatment plan for decreasing falls as evidenced by patient reporting and review of EMR . Over the next 30 days, patient will verbalize using fall risk reduction strategies discussed . Over the next 30 days, patient will not experience additional falls   Interventions:  . Provided written and verbal education re: Potential causes of falls and Fall prevention strategies . Reviewed medications and discussed potential side effects of medications such as dizziness and frequent urination . Assessed for s/s of orthostatic hypotension . Assessed for falls since last encounter. . Assessed patients knowledge of fall risk prevention secondary to previously provided education. . Assessed working status of life alert bracelet and patient adherence . Provided patient information for fall alert systems . Evaluation of current treatment plan related to fall pevention and patient's adherence to plan as established by provider. . Provided education to patient re: Nurse, adult on fall prevention and safety measures. . Discussed plans with patient for ongoing care management follow up and provided patient with direct contact information for care management team . Provided patient with emmi educational materials related to fall prevention . Reviewed scheduled/upcoming provider appointments including:   Patient Self Care Activities:  . Utilize walker/wheelchair/cane (assistive device) appropriately with all ambulation . De-clutter walkways . Change positions slowly . Wear secure fitting shoes at all times with ambulation . Utilize home lighting for dim lit areas . Have self and pet awareness at all times  Plan: . CCM RN CM will follow up in one month.   Initial goal documentation        Raina Mina, RN Care Management Coordinator  [employer] trader [phone]

## 2019-09-30 DIAGNOSIS — D631 Anemia in chronic kidney disease: Secondary | ICD-10-CM | POA: Diagnosis not present

## 2019-09-30 DIAGNOSIS — S72141D Displaced intertrochanteric fracture of right femur, subsequent encounter for closed fracture with routine healing: Secondary | ICD-10-CM | POA: Diagnosis not present

## 2019-09-30 DIAGNOSIS — N189 Chronic kidney disease, unspecified: Secondary | ICD-10-CM | POA: Diagnosis not present

## 2019-09-30 DIAGNOSIS — I10 Essential (primary) hypertension: Secondary | ICD-10-CM | POA: Diagnosis not present

## 2019-09-30 DIAGNOSIS — I129 Hypertensive chronic kidney disease with stage 1 through stage 4 chronic kidney disease, or unspecified chronic kidney disease: Secondary | ICD-10-CM | POA: Diagnosis not present

## 2019-09-30 DIAGNOSIS — Z8249 Family history of ischemic heart disease and other diseases of the circulatory system: Secondary | ICD-10-CM | POA: Diagnosis not present

## 2019-09-30 DIAGNOSIS — M810 Age-related osteoporosis without current pathological fracture: Secondary | ICD-10-CM | POA: Diagnosis not present

## 2019-09-30 DIAGNOSIS — F329 Major depressive disorder, single episode, unspecified: Secondary | ICD-10-CM | POA: Diagnosis not present

## 2019-10-04 DIAGNOSIS — R3911 Hesitancy of micturition: Secondary | ICD-10-CM | POA: Diagnosis not present

## 2019-10-04 DIAGNOSIS — S72001D Fracture of unspecified part of neck of right femur, subsequent encounter for closed fracture with routine healing: Secondary | ICD-10-CM | POA: Diagnosis not present

## 2019-10-04 DIAGNOSIS — M810 Age-related osteoporosis without current pathological fracture: Secondary | ICD-10-CM | POA: Diagnosis not present

## 2019-10-06 DIAGNOSIS — S72141D Displaced intertrochanteric fracture of right femur, subsequent encounter for closed fracture with routine healing: Secondary | ICD-10-CM | POA: Diagnosis not present

## 2019-10-06 DIAGNOSIS — I129 Hypertensive chronic kidney disease with stage 1 through stage 4 chronic kidney disease, or unspecified chronic kidney disease: Secondary | ICD-10-CM | POA: Diagnosis not present

## 2019-10-06 DIAGNOSIS — M810 Age-related osteoporosis without current pathological fracture: Secondary | ICD-10-CM | POA: Diagnosis not present

## 2019-10-06 DIAGNOSIS — N189 Chronic kidney disease, unspecified: Secondary | ICD-10-CM | POA: Diagnosis not present

## 2019-10-06 DIAGNOSIS — F329 Major depressive disorder, single episode, unspecified: Secondary | ICD-10-CM | POA: Diagnosis not present

## 2019-10-06 DIAGNOSIS — D631 Anemia in chronic kidney disease: Secondary | ICD-10-CM | POA: Diagnosis not present

## 2019-10-07 DIAGNOSIS — S72141A Displaced intertrochanteric fracture of right femur, initial encounter for closed fracture: Secondary | ICD-10-CM | POA: Diagnosis not present

## 2019-10-07 DIAGNOSIS — M25561 Pain in right knee: Secondary | ICD-10-CM | POA: Diagnosis not present

## 2019-10-08 DIAGNOSIS — N189 Chronic kidney disease, unspecified: Secondary | ICD-10-CM | POA: Diagnosis not present

## 2019-10-08 DIAGNOSIS — I129 Hypertensive chronic kidney disease with stage 1 through stage 4 chronic kidney disease, or unspecified chronic kidney disease: Secondary | ICD-10-CM | POA: Diagnosis not present

## 2019-10-08 DIAGNOSIS — M810 Age-related osteoporosis without current pathological fracture: Secondary | ICD-10-CM | POA: Diagnosis not present

## 2019-10-08 DIAGNOSIS — S72141D Displaced intertrochanteric fracture of right femur, subsequent encounter for closed fracture with routine healing: Secondary | ICD-10-CM | POA: Diagnosis not present

## 2019-10-08 DIAGNOSIS — D631 Anemia in chronic kidney disease: Secondary | ICD-10-CM | POA: Diagnosis not present

## 2019-10-08 DIAGNOSIS — F329 Major depressive disorder, single episode, unspecified: Secondary | ICD-10-CM | POA: Diagnosis not present

## 2019-10-13 DIAGNOSIS — F329 Major depressive disorder, single episode, unspecified: Secondary | ICD-10-CM | POA: Diagnosis not present

## 2019-10-13 DIAGNOSIS — I129 Hypertensive chronic kidney disease with stage 1 through stage 4 chronic kidney disease, or unspecified chronic kidney disease: Secondary | ICD-10-CM | POA: Diagnosis not present

## 2019-10-13 DIAGNOSIS — S72141D Displaced intertrochanteric fracture of right femur, subsequent encounter for closed fracture with routine healing: Secondary | ICD-10-CM | POA: Diagnosis not present

## 2019-10-13 DIAGNOSIS — M810 Age-related osteoporosis without current pathological fracture: Secondary | ICD-10-CM | POA: Diagnosis not present

## 2019-10-13 DIAGNOSIS — N189 Chronic kidney disease, unspecified: Secondary | ICD-10-CM | POA: Diagnosis not present

## 2019-10-13 DIAGNOSIS — D631 Anemia in chronic kidney disease: Secondary | ICD-10-CM | POA: Diagnosis not present

## 2019-10-14 DIAGNOSIS — N189 Chronic kidney disease, unspecified: Secondary | ICD-10-CM | POA: Diagnosis not present

## 2019-10-14 DIAGNOSIS — F329 Major depressive disorder, single episode, unspecified: Secondary | ICD-10-CM | POA: Diagnosis not present

## 2019-10-14 DIAGNOSIS — D631 Anemia in chronic kidney disease: Secondary | ICD-10-CM | POA: Diagnosis not present

## 2019-10-14 DIAGNOSIS — I129 Hypertensive chronic kidney disease with stage 1 through stage 4 chronic kidney disease, or unspecified chronic kidney disease: Secondary | ICD-10-CM | POA: Diagnosis not present

## 2019-10-14 DIAGNOSIS — M810 Age-related osteoporosis without current pathological fracture: Secondary | ICD-10-CM | POA: Diagnosis not present

## 2019-10-14 DIAGNOSIS — S72141D Displaced intertrochanteric fracture of right femur, subsequent encounter for closed fracture with routine healing: Secondary | ICD-10-CM | POA: Diagnosis not present

## 2019-10-18 DIAGNOSIS — M810 Age-related osteoporosis without current pathological fracture: Secondary | ICD-10-CM | POA: Diagnosis not present

## 2019-10-18 DIAGNOSIS — C8203 Follicular lymphoma grade I, intra-abdominal lymph nodes: Secondary | ICD-10-CM | POA: Diagnosis not present

## 2019-10-18 DIAGNOSIS — Z853 Personal history of malignant neoplasm of breast: Secondary | ICD-10-CM | POA: Diagnosis not present

## 2019-10-18 DIAGNOSIS — C3481 Malignant neoplasm of overlapping sites of right bronchus and lung: Secondary | ICD-10-CM | POA: Diagnosis not present

## 2019-10-21 DIAGNOSIS — F329 Major depressive disorder, single episode, unspecified: Secondary | ICD-10-CM | POA: Diagnosis not present

## 2019-10-21 DIAGNOSIS — D631 Anemia in chronic kidney disease: Secondary | ICD-10-CM | POA: Diagnosis not present

## 2019-10-21 DIAGNOSIS — M810 Age-related osteoporosis without current pathological fracture: Secondary | ICD-10-CM | POA: Diagnosis not present

## 2019-10-21 DIAGNOSIS — S72141D Displaced intertrochanteric fracture of right femur, subsequent encounter for closed fracture with routine healing: Secondary | ICD-10-CM | POA: Diagnosis not present

## 2019-10-21 DIAGNOSIS — I129 Hypertensive chronic kidney disease with stage 1 through stage 4 chronic kidney disease, or unspecified chronic kidney disease: Secondary | ICD-10-CM | POA: Diagnosis not present

## 2019-10-21 DIAGNOSIS — N189 Chronic kidney disease, unspecified: Secondary | ICD-10-CM | POA: Diagnosis not present

## 2019-10-22 DIAGNOSIS — I129 Hypertensive chronic kidney disease with stage 1 through stage 4 chronic kidney disease, or unspecified chronic kidney disease: Secondary | ICD-10-CM | POA: Diagnosis not present

## 2019-10-22 DIAGNOSIS — N189 Chronic kidney disease, unspecified: Secondary | ICD-10-CM | POA: Diagnosis not present

## 2019-10-22 DIAGNOSIS — D631 Anemia in chronic kidney disease: Secondary | ICD-10-CM | POA: Diagnosis not present

## 2019-10-22 DIAGNOSIS — M810 Age-related osteoporosis without current pathological fracture: Secondary | ICD-10-CM | POA: Diagnosis not present

## 2019-10-22 DIAGNOSIS — F329 Major depressive disorder, single episode, unspecified: Secondary | ICD-10-CM | POA: Diagnosis not present

## 2019-10-22 DIAGNOSIS — S72141D Displaced intertrochanteric fracture of right femur, subsequent encounter for closed fracture with routine healing: Secondary | ICD-10-CM | POA: Diagnosis not present

## 2019-10-25 DIAGNOSIS — M810 Age-related osteoporosis without current pathological fracture: Secondary | ICD-10-CM | POA: Diagnosis not present

## 2019-10-25 DIAGNOSIS — F329 Major depressive disorder, single episode, unspecified: Secondary | ICD-10-CM | POA: Diagnosis not present

## 2019-10-25 DIAGNOSIS — F419 Anxiety disorder, unspecified: Secondary | ICD-10-CM | POA: Diagnosis not present

## 2019-10-25 DIAGNOSIS — Z9181 History of falling: Secondary | ICD-10-CM | POA: Diagnosis not present

## 2019-10-25 DIAGNOSIS — D6481 Anemia due to antineoplastic chemotherapy: Secondary | ICD-10-CM | POA: Diagnosis not present

## 2019-10-25 DIAGNOSIS — Z170 Estrogen receptor positive status [ER+]: Secondary | ICD-10-CM | POA: Diagnosis not present

## 2019-10-25 DIAGNOSIS — I129 Hypertensive chronic kidney disease with stage 1 through stage 4 chronic kidney disease, or unspecified chronic kidney disease: Secondary | ICD-10-CM | POA: Diagnosis not present

## 2019-10-25 DIAGNOSIS — E46 Unspecified protein-calorie malnutrition: Secondary | ICD-10-CM | POA: Diagnosis not present

## 2019-10-25 DIAGNOSIS — Z79899 Other long term (current) drug therapy: Secondary | ICD-10-CM | POA: Diagnosis not present

## 2019-10-25 DIAGNOSIS — Z79810 Long term (current) use of selective estrogen receptor modulators (SERMs): Secondary | ICD-10-CM | POA: Diagnosis not present

## 2019-10-25 DIAGNOSIS — C7801 Secondary malignant neoplasm of right lung: Secondary | ICD-10-CM | POA: Diagnosis not present

## 2019-10-25 DIAGNOSIS — Z96651 Presence of right artificial knee joint: Secondary | ICD-10-CM | POA: Diagnosis not present

## 2019-10-25 DIAGNOSIS — T451X5A Adverse effect of antineoplastic and immunosuppressive drugs, initial encounter: Secondary | ICD-10-CM | POA: Diagnosis not present

## 2019-10-25 DIAGNOSIS — C782 Secondary malignant neoplasm of pleura: Secondary | ICD-10-CM | POA: Diagnosis not present

## 2019-10-25 DIAGNOSIS — Z792 Long term (current) use of antibiotics: Secondary | ICD-10-CM | POA: Diagnosis not present

## 2019-10-25 DIAGNOSIS — C50912 Malignant neoplasm of unspecified site of left female breast: Secondary | ICD-10-CM | POA: Diagnosis not present

## 2019-10-25 DIAGNOSIS — C7951 Secondary malignant neoplasm of bone: Secondary | ICD-10-CM | POA: Diagnosis not present

## 2019-10-25 DIAGNOSIS — M7061 Trochanteric bursitis, right hip: Secondary | ICD-10-CM | POA: Diagnosis not present

## 2019-10-25 DIAGNOSIS — D631 Anemia in chronic kidney disease: Secondary | ICD-10-CM | POA: Diagnosis not present

## 2019-10-25 DIAGNOSIS — C342 Malignant neoplasm of middle lobe, bronchus or lung: Secondary | ICD-10-CM | POA: Diagnosis not present

## 2019-10-25 DIAGNOSIS — G4700 Insomnia, unspecified: Secondary | ICD-10-CM | POA: Diagnosis not present

## 2019-10-25 DIAGNOSIS — S72141D Displaced intertrochanteric fracture of right femur, subsequent encounter for closed fracture with routine healing: Secondary | ICD-10-CM | POA: Diagnosis not present

## 2019-10-25 DIAGNOSIS — E039 Hypothyroidism, unspecified: Secondary | ICD-10-CM | POA: Diagnosis not present

## 2019-10-25 DIAGNOSIS — N189 Chronic kidney disease, unspecified: Secondary | ICD-10-CM | POA: Diagnosis not present

## 2019-10-27 DIAGNOSIS — N189 Chronic kidney disease, unspecified: Secondary | ICD-10-CM | POA: Diagnosis not present

## 2019-10-27 DIAGNOSIS — S72141D Displaced intertrochanteric fracture of right femur, subsequent encounter for closed fracture with routine healing: Secondary | ICD-10-CM | POA: Diagnosis not present

## 2019-10-27 DIAGNOSIS — C7801 Secondary malignant neoplasm of right lung: Secondary | ICD-10-CM | POA: Diagnosis not present

## 2019-10-27 DIAGNOSIS — C342 Malignant neoplasm of middle lobe, bronchus or lung: Secondary | ICD-10-CM | POA: Diagnosis not present

## 2019-10-27 DIAGNOSIS — M810 Age-related osteoporosis without current pathological fracture: Secondary | ICD-10-CM | POA: Diagnosis not present

## 2019-10-27 DIAGNOSIS — I129 Hypertensive chronic kidney disease with stage 1 through stage 4 chronic kidney disease, or unspecified chronic kidney disease: Secondary | ICD-10-CM | POA: Diagnosis not present

## 2019-10-28 ENCOUNTER — Other Ambulatory Visit: Payer: Self-pay | Admitting: *Deleted

## 2019-10-28 DIAGNOSIS — C7801 Secondary malignant neoplasm of right lung: Secondary | ICD-10-CM | POA: Diagnosis not present

## 2019-10-28 DIAGNOSIS — C342 Malignant neoplasm of middle lobe, bronchus or lung: Secondary | ICD-10-CM | POA: Diagnosis not present

## 2019-10-28 DIAGNOSIS — N189 Chronic kidney disease, unspecified: Secondary | ICD-10-CM | POA: Diagnosis not present

## 2019-10-28 DIAGNOSIS — M810 Age-related osteoporosis without current pathological fracture: Secondary | ICD-10-CM | POA: Diagnosis not present

## 2019-10-28 DIAGNOSIS — I129 Hypertensive chronic kidney disease with stage 1 through stage 4 chronic kidney disease, or unspecified chronic kidney disease: Secondary | ICD-10-CM | POA: Diagnosis not present

## 2019-10-28 DIAGNOSIS — S72141D Displaced intertrochanteric fracture of right femur, subsequent encounter for closed fracture with routine healing: Secondary | ICD-10-CM | POA: Diagnosis not present

## 2019-10-28 NOTE — Patient Outreach (Addendum)
Marilyn Richard [facility]) Care Management  10/28/2019  Marilyn Richard Marilyn Richard October 26, 1926 [id number]  Telephone Assessment-Successful  RN spoke with daughter-in-law Marilyn Richard who provided an update on pt's ongoing therapies and overall health with her follow up visits.  Reports pt continue to work with [facility] with once weekly visits due to the lack of staff [facility]). Pt also has bursitis and this has limited her mobility requiring pt to receive injections for increase mobility to her ankle/knee. Encouraged family to work with pt with the ongoing recommended therapy recommended by there therapist other days during the week to keep pt mobile for the ongoing therapies offered through the agency.Caregiver denies any falls or related incidents as pt continue to use her assisted devices.  Plan of care discussed with received [facility] packet with fall prevention information reviewed.Will continue to encourage adherence with all discussed and safety measures on fall preventions. Will also verify pt's remains stable with her blood pressures due to her history of HTN. Caregiver reports pt's blood pressures medications was discontinued due to hypotension and pt continues to remain stable at this time (controlled).  Plan: Will follow up next month with ongoing case management services and re-evaluate pt's progress related to the current plan of care in place. No needs or request at this time.  Goals Addressed            This Visit's Progress   . to get back to my routine   On track    Marilyn Richard (see longitudinal plan of care for additional care plan information)  Current Barriers:  Marland Kitchen Knowledge Deficits related to fall precautions . Decreased adherence to prescribed treatment for fall prevention . Knowledge Deficits related to prevention measures (falls)  Clinical Goal(s):  Marland Kitchen Over the next 45 days, patient will demonstrate improved adherence to prescribed treatment plan  for decreasing falls as evidenced by patient reporting and review of EMR . Over the next 30 days, patient will verbalize using fall risk reduction strategies discussed . Over the next 30 days, patient will not experience additional falls   Interventions:  . Provided written and verbal education re: Potential causes of falls and Fall prevention strategies . Reviewed medications and discussed potential side effects of medications such as dizziness and frequent urination . Assessed for s/s of orthostatic hypotension . Assessed for falls since last encounter. . Assessed patients knowledge of fall risk prevention secondary to previously provided education. . Assessed working status of life alert bracelet and patient adherence . Provided patient information for fall alert systems . Evaluation of current treatment plan related to fall pevention and patient's adherence to plan as established by provider. . Provided education to patient re: Nurse, adult on fall prevention and safety measures. . Discussed plans with patient for ongoing care management follow up and provided patient with direct contact information for care management team . Provided patient with emmi educational materials related to fall prevention . Reviewed scheduled/upcoming provider appointments including:   Patient Self Care Activities:  . Utilize walker/wheelchair/cane (assistive device) appropriately with all ambulation . De-clutter walkways . Change positions slowly . Wear secure fitting shoes at all times with ambulation . Utilize home lighting for dim lit areas . Have self and pet awareness at all times  Plan: . CCM RN CM will follow up in one month.   Initial goal documentation        Marilyn Mina, RN Care Management Coordinator [place] Office [phone]

## 2019-11-03 DIAGNOSIS — S72141D Displaced intertrochanteric fracture of right femur, subsequent encounter for closed fracture with routine healing: Secondary | ICD-10-CM | POA: Diagnosis not present

## 2019-11-03 DIAGNOSIS — N189 Chronic kidney disease, unspecified: Secondary | ICD-10-CM | POA: Diagnosis not present

## 2019-11-03 DIAGNOSIS — C7801 Secondary malignant neoplasm of right lung: Secondary | ICD-10-CM | POA: Diagnosis not present

## 2019-11-03 DIAGNOSIS — C342 Malignant neoplasm of middle lobe, bronchus or lung: Secondary | ICD-10-CM | POA: Diagnosis not present

## 2019-11-03 DIAGNOSIS — M810 Age-related osteoporosis without current pathological fracture: Secondary | ICD-10-CM | POA: Diagnosis not present

## 2019-11-03 DIAGNOSIS — I129 Hypertensive chronic kidney disease with stage 1 through stage 4 chronic kidney disease, or unspecified chronic kidney disease: Secondary | ICD-10-CM | POA: Diagnosis not present

## 2019-11-09 DIAGNOSIS — C7801 Secondary malignant neoplasm of right lung: Secondary | ICD-10-CM | POA: Diagnosis not present

## 2019-11-09 DIAGNOSIS — N189 Chronic kidney disease, unspecified: Secondary | ICD-10-CM | POA: Diagnosis not present

## 2019-11-09 DIAGNOSIS — I129 Hypertensive chronic kidney disease with stage 1 through stage 4 chronic kidney disease, or unspecified chronic kidney disease: Secondary | ICD-10-CM | POA: Diagnosis not present

## 2019-11-09 DIAGNOSIS — C342 Malignant neoplasm of middle lobe, bronchus or lung: Secondary | ICD-10-CM | POA: Diagnosis not present

## 2019-11-09 DIAGNOSIS — S72141D Displaced intertrochanteric fracture of right femur, subsequent encounter for closed fracture with routine healing: Secondary | ICD-10-CM | POA: Diagnosis not present

## 2019-11-09 DIAGNOSIS — M810 Age-related osteoporosis without current pathological fracture: Secondary | ICD-10-CM | POA: Diagnosis not present

## 2019-11-10 DIAGNOSIS — C342 Malignant neoplasm of middle lobe, bronchus or lung: Secondary | ICD-10-CM | POA: Diagnosis not present

## 2019-11-10 DIAGNOSIS — M810 Age-related osteoporosis without current pathological fracture: Secondary | ICD-10-CM | POA: Diagnosis not present

## 2019-11-10 DIAGNOSIS — I129 Hypertensive chronic kidney disease with stage 1 through stage 4 chronic kidney disease, or unspecified chronic kidney disease: Secondary | ICD-10-CM | POA: Diagnosis not present

## 2019-11-10 DIAGNOSIS — N189 Chronic kidney disease, unspecified: Secondary | ICD-10-CM | POA: Diagnosis not present

## 2019-11-10 DIAGNOSIS — S72141D Displaced intertrochanteric fracture of right femur, subsequent encounter for closed fracture with routine healing: Secondary | ICD-10-CM | POA: Diagnosis not present

## 2019-11-10 DIAGNOSIS — C7801 Secondary malignant neoplasm of right lung: Secondary | ICD-10-CM | POA: Diagnosis not present

## 2019-11-15 DIAGNOSIS — S72141D Displaced intertrochanteric fracture of right femur, subsequent encounter for closed fracture with routine healing: Secondary | ICD-10-CM | POA: Diagnosis not present

## 2019-11-15 DIAGNOSIS — M810 Age-related osteoporosis without current pathological fracture: Secondary | ICD-10-CM | POA: Diagnosis not present

## 2019-11-15 DIAGNOSIS — C342 Malignant neoplasm of middle lobe, bronchus or lung: Secondary | ICD-10-CM | POA: Diagnosis not present

## 2019-11-15 DIAGNOSIS — N189 Chronic kidney disease, unspecified: Secondary | ICD-10-CM | POA: Diagnosis not present

## 2019-11-15 DIAGNOSIS — C7801 Secondary malignant neoplasm of right lung: Secondary | ICD-10-CM | POA: Diagnosis not present

## 2019-11-15 DIAGNOSIS — I129 Hypertensive chronic kidney disease with stage 1 through stage 4 chronic kidney disease, or unspecified chronic kidney disease: Secondary | ICD-10-CM | POA: Diagnosis not present

## 2019-11-16 DIAGNOSIS — J439 Emphysema, unspecified: Secondary | ICD-10-CM | POA: Diagnosis not present

## 2019-11-16 DIAGNOSIS — Z5111 Encounter for antineoplastic chemotherapy: Secondary | ICD-10-CM | POA: Diagnosis not present

## 2019-11-16 DIAGNOSIS — I77810 Thoracic aortic ectasia: Secondary | ICD-10-CM | POA: Diagnosis not present

## 2019-11-16 DIAGNOSIS — I2699 Other pulmonary embolism without acute cor pulmonale: Secondary | ICD-10-CM | POA: Diagnosis not present

## 2019-11-16 DIAGNOSIS — R970 Elevated carcinoembryonic antigen [CEA]: Secondary | ICD-10-CM | POA: Diagnosis not present

## 2019-11-16 DIAGNOSIS — I700 Atherosclerosis of aorta: Secondary | ICD-10-CM | POA: Diagnosis not present

## 2019-11-16 DIAGNOSIS — C342 Malignant neoplasm of middle lobe, bronchus or lung: Secondary | ICD-10-CM | POA: Diagnosis not present

## 2019-11-18 DIAGNOSIS — I2699 Other pulmonary embolism without acute cor pulmonale: Secondary | ICD-10-CM | POA: Diagnosis not present

## 2019-11-18 DIAGNOSIS — M545 Low back pain: Secondary | ICD-10-CM | POA: Diagnosis not present

## 2019-11-18 DIAGNOSIS — G8929 Other chronic pain: Secondary | ICD-10-CM | POA: Diagnosis not present

## 2019-11-18 DIAGNOSIS — C50312 Malignant neoplasm of lower-inner quadrant of left female breast: Secondary | ICD-10-CM | POA: Diagnosis not present

## 2019-11-18 DIAGNOSIS — E871 Hypo-osmolality and hyponatremia: Secondary | ICD-10-CM | POA: Diagnosis not present

## 2019-11-18 DIAGNOSIS — M2559 Pain in other specified joint: Secondary | ICD-10-CM | POA: Diagnosis not present

## 2019-11-18 DIAGNOSIS — M8589 Other specified disorders of bone density and structure, multiple sites: Secondary | ICD-10-CM | POA: Diagnosis not present

## 2019-11-18 DIAGNOSIS — M810 Age-related osteoporosis without current pathological fracture: Secondary | ICD-10-CM | POA: Diagnosis not present

## 2019-11-18 DIAGNOSIS — C342 Malignant neoplasm of middle lobe, bronchus or lung: Secondary | ICD-10-CM | POA: Diagnosis not present

## 2019-11-18 DIAGNOSIS — D696 Thrombocytopenia, unspecified: Secondary | ICD-10-CM | POA: Diagnosis not present

## 2019-11-18 DIAGNOSIS — F329 Major depressive disorder, single episode, unspecified: Secondary | ICD-10-CM | POA: Diagnosis not present

## 2019-11-22 DIAGNOSIS — C342 Malignant neoplasm of middle lobe, bronchus or lung: Secondary | ICD-10-CM | POA: Diagnosis not present

## 2019-11-22 DIAGNOSIS — M810 Age-related osteoporosis without current pathological fracture: Secondary | ICD-10-CM | POA: Diagnosis not present

## 2019-11-22 DIAGNOSIS — N189 Chronic kidney disease, unspecified: Secondary | ICD-10-CM | POA: Diagnosis not present

## 2019-11-22 DIAGNOSIS — C7801 Secondary malignant neoplasm of right lung: Secondary | ICD-10-CM | POA: Diagnosis not present

## 2019-11-22 DIAGNOSIS — I129 Hypertensive chronic kidney disease with stage 1 through stage 4 chronic kidney disease, or unspecified chronic kidney disease: Secondary | ICD-10-CM | POA: Diagnosis not present

## 2019-11-22 DIAGNOSIS — S72141D Displaced intertrochanteric fracture of right femur, subsequent encounter for closed fracture with routine healing: Secondary | ICD-10-CM | POA: Diagnosis not present

## 2019-11-23 DIAGNOSIS — N189 Chronic kidney disease, unspecified: Secondary | ICD-10-CM | POA: Diagnosis not present

## 2019-11-23 DIAGNOSIS — I129 Hypertensive chronic kidney disease with stage 1 through stage 4 chronic kidney disease, or unspecified chronic kidney disease: Secondary | ICD-10-CM | POA: Diagnosis not present

## 2019-11-23 DIAGNOSIS — M810 Age-related osteoporosis without current pathological fracture: Secondary | ICD-10-CM | POA: Diagnosis not present

## 2019-11-23 DIAGNOSIS — C7801 Secondary malignant neoplasm of right lung: Secondary | ICD-10-CM | POA: Diagnosis not present

## 2019-11-23 DIAGNOSIS — C342 Malignant neoplasm of middle lobe, bronchus or lung: Secondary | ICD-10-CM | POA: Diagnosis not present

## 2019-11-23 DIAGNOSIS — S72141D Displaced intertrochanteric fracture of right femur, subsequent encounter for closed fracture with routine healing: Secondary | ICD-10-CM | POA: Diagnosis not present

## 2019-11-24 DIAGNOSIS — G4700 Insomnia, unspecified: Secondary | ICD-10-CM | POA: Diagnosis not present

## 2019-11-24 DIAGNOSIS — C7951 Secondary malignant neoplasm of bone: Secondary | ICD-10-CM | POA: Diagnosis not present

## 2019-11-24 DIAGNOSIS — C782 Secondary malignant neoplasm of pleura: Secondary | ICD-10-CM | POA: Diagnosis not present

## 2019-11-24 DIAGNOSIS — Z96651 Presence of right artificial knee joint: Secondary | ICD-10-CM | POA: Diagnosis not present

## 2019-11-24 DIAGNOSIS — Z792 Long term (current) use of antibiotics: Secondary | ICD-10-CM | POA: Diagnosis not present

## 2019-11-24 DIAGNOSIS — S72141A Displaced intertrochanteric fracture of right femur, initial encounter for closed fracture: Secondary | ICD-10-CM | POA: Diagnosis not present

## 2019-11-24 DIAGNOSIS — C50912 Malignant neoplasm of unspecified site of left female breast: Secondary | ICD-10-CM | POA: Diagnosis not present

## 2019-11-24 DIAGNOSIS — Z170 Estrogen receptor positive status [ER+]: Secondary | ICD-10-CM | POA: Diagnosis not present

## 2019-11-24 DIAGNOSIS — D6481 Anemia due to antineoplastic chemotherapy: Secondary | ICD-10-CM | POA: Diagnosis not present

## 2019-11-24 DIAGNOSIS — E039 Hypothyroidism, unspecified: Secondary | ICD-10-CM | POA: Diagnosis not present

## 2019-11-24 DIAGNOSIS — C342 Malignant neoplasm of middle lobe, bronchus or lung: Secondary | ICD-10-CM | POA: Diagnosis not present

## 2019-11-24 DIAGNOSIS — T451X5A Adverse effect of antineoplastic and immunosuppressive drugs, initial encounter: Secondary | ICD-10-CM | POA: Diagnosis not present

## 2019-11-24 DIAGNOSIS — Z79810 Long term (current) use of selective estrogen receptor modulators (SERMs): Secondary | ICD-10-CM | POA: Diagnosis not present

## 2019-11-24 DIAGNOSIS — N189 Chronic kidney disease, unspecified: Secondary | ICD-10-CM | POA: Diagnosis not present

## 2019-11-24 DIAGNOSIS — Z9181 History of falling: Secondary | ICD-10-CM | POA: Diagnosis not present

## 2019-11-24 DIAGNOSIS — F419 Anxiety disorder, unspecified: Secondary | ICD-10-CM | POA: Diagnosis not present

## 2019-11-24 DIAGNOSIS — E46 Unspecified protein-calorie malnutrition: Secondary | ICD-10-CM | POA: Diagnosis not present

## 2019-11-24 DIAGNOSIS — F329 Major depressive disorder, single episode, unspecified: Secondary | ICD-10-CM | POA: Diagnosis not present

## 2019-11-24 DIAGNOSIS — I2699 Other pulmonary embolism without acute cor pulmonale: Secondary | ICD-10-CM | POA: Diagnosis not present

## 2019-11-24 DIAGNOSIS — S72141D Displaced intertrochanteric fracture of right femur, subsequent encounter for closed fracture with routine healing: Secondary | ICD-10-CM | POA: Diagnosis not present

## 2019-11-24 DIAGNOSIS — Z79899 Other long term (current) drug therapy: Secondary | ICD-10-CM | POA: Diagnosis not present

## 2019-11-24 DIAGNOSIS — D631 Anemia in chronic kidney disease: Secondary | ICD-10-CM | POA: Diagnosis not present

## 2019-11-24 DIAGNOSIS — M810 Age-related osteoporosis without current pathological fracture: Secondary | ICD-10-CM | POA: Diagnosis not present

## 2019-11-24 DIAGNOSIS — C7801 Secondary malignant neoplasm of right lung: Secondary | ICD-10-CM | POA: Diagnosis not present

## 2019-11-24 DIAGNOSIS — I129 Hypertensive chronic kidney disease with stage 1 through stage 4 chronic kidney disease, or unspecified chronic kidney disease: Secondary | ICD-10-CM | POA: Diagnosis not present

## 2019-11-29 ENCOUNTER — Other Ambulatory Visit: Payer: Self-pay | Admitting: *Deleted

## 2019-11-29 DIAGNOSIS — M810 Age-related osteoporosis without current pathological fracture: Secondary | ICD-10-CM | POA: Diagnosis not present

## 2019-11-29 DIAGNOSIS — S72141D Displaced intertrochanteric fracture of right femur, subsequent encounter for closed fracture with routine healing: Secondary | ICD-10-CM | POA: Diagnosis not present

## 2019-11-29 DIAGNOSIS — I129 Hypertensive chronic kidney disease with stage 1 through stage 4 chronic kidney disease, or unspecified chronic kidney disease: Secondary | ICD-10-CM | POA: Diagnosis not present

## 2019-11-29 DIAGNOSIS — C7801 Secondary malignant neoplasm of right lung: Secondary | ICD-10-CM | POA: Diagnosis not present

## 2019-11-29 DIAGNOSIS — C342 Malignant neoplasm of middle lobe, bronchus or lung: Secondary | ICD-10-CM | POA: Diagnosis not present

## 2019-11-29 DIAGNOSIS — I2699 Other pulmonary embolism without acute cor pulmonale: Secondary | ICD-10-CM | POA: Diagnosis not present

## 2019-11-29 NOTE — Patient Outreach (Signed)
[facility]) Care Management  11/29/2019  Rejeana Fadness Demia Viera [birth year] [id number]   Telephone Assessment-Unsuccessful  RN attempted outreach call however unsuccessful. RN able to leave a HIPAA approved voice message requesting a call back.   Plan: Will attempt another outreach call over the next week for ongoing [facility] services.  Raina Mina, RN Care Management Coordinator [place] Office [phone]

## 2019-12-03 ENCOUNTER — Other Ambulatory Visit: Payer: Self-pay | Admitting: *Deleted

## 2019-12-03 DIAGNOSIS — Z23 Encounter for immunization: Secondary | ICD-10-CM | POA: Diagnosis not present

## 2019-12-03 DIAGNOSIS — L989 Disorder of the skin and subcutaneous tissue, unspecified: Secondary | ICD-10-CM | POA: Diagnosis not present

## 2019-12-03 DIAGNOSIS — I10 Essential (primary) hypertension: Secondary | ICD-10-CM | POA: Diagnosis not present

## 2019-12-03 NOTE — Patient Outreach (Signed)
[facility]) Care Management  12/03/2019  Mattelyn Imhoff Pyper [place] August 26, 1926 [id number]   Telephone Assessment-Successful  RN attempted call to daughter [name] however unsuccessful and left a HIPAA approved voice message requesting a call back. RN completed a call to the pt directly and received an update on pt's ongoing care. Pt reports she finished with the involved [place] agency with PT and denies any falls or incidents related. Reports some occassional discomfortPt states she has "blood clots" and started a new medications Xarelto currently at 20 mg with plans to wean to 10 mg soon based upon the results. RN able to verify pt continue to administer all medications as prescribed and attend all medical appointments as scheduled. Care plan updated as pt continues to be on track with her goals and met 2 of her 30 days goals as recognized today. Will continue to review all goals and interventions. Will encourage ongoing adherence with her management of care and offer available resources accordingly. Will also reiterated on the use of all her DME to prevent risk of falls and continue to verify no fall related injuries over the last month.   Plan: Will follow up next month with her ongoing plan of care and continue to be available for needed resources.  Goals Addressed            This Visit's Progress   . [facility] 12/03/2019 Update   On track    [place] (see longitudinal plan of care for additional care plan information)  Current Barriers:  Marland Kitchen Knowledge Deficits related to fall precautions . Decreased adherence to prescribed treatment for fall prevention . Knowledge Deficits related to prevention measures (falls)  Clinical Goal(s):  Marland Kitchen Over the next 45 days, patient will demonstrate improved adherence to prescribed treatment plan for decreasing falls as evidenced by patient reporting and review of EMR . Over the next 30 days, patient will verbalize using fall risk  reduction strategies discussed-30 day goal met . Over the next 30 days, patient will not experience additional falls-30 day goal met   Interventions:  . Provided written and verbal education re: Potential causes of falls and Fall prevention strategies . Reviewed medications and discussed potential side effects of medications such as dizziness and frequent urination . Assessed for s/s of orthostatic hypotension . Assessed for falls since last encounter. . Assessed patients knowledge of fall risk prevention secondary to previously provided education. . Assessed working status of life alert bracelet and patient adherence . Provided patient information for fall alert systems . Evaluation of current treatment plan related to fall pevention and patient's adherence to plan as established by provider. . Provided education to patient re: Nurse, adult on fall prevention and safety measures. . Discussed plans with patient for ongoing care management follow up and provided patient with direct contact information for care management team . Provided patient with emmi educational materials related to fall prevention . Reviewed scheduled/upcoming provider appointments including:   Patient Self Care Activities:  . Utilize walker/wheelchair/cane (assistive device) appropriately with all ambulation . De-clutter walkways . Change positions slowly . Wear secure fitting shoes at all times with ambulation . Utilize home lighting for dim lit areas . Have self and pet awareness at all times  Plan: . CCM RN CM will follow up in one month.   Initial goal documentation        Raina Mina, RN Care Management Coordinator [place] Office [phone]

## 2019-12-06 DIAGNOSIS — I129 Hypertensive chronic kidney disease with stage 1 through stage 4 chronic kidney disease, or unspecified chronic kidney disease: Secondary | ICD-10-CM | POA: Diagnosis not present

## 2019-12-06 DIAGNOSIS — M810 Age-related osteoporosis without current pathological fracture: Secondary | ICD-10-CM | POA: Diagnosis not present

## 2019-12-06 DIAGNOSIS — C342 Malignant neoplasm of middle lobe, bronchus or lung: Secondary | ICD-10-CM | POA: Diagnosis not present

## 2019-12-06 DIAGNOSIS — C7801 Secondary malignant neoplasm of right lung: Secondary | ICD-10-CM | POA: Diagnosis not present

## 2019-12-06 DIAGNOSIS — I2699 Other pulmonary embolism without acute cor pulmonale: Secondary | ICD-10-CM | POA: Diagnosis not present

## 2019-12-06 DIAGNOSIS — S72141D Displaced intertrochanteric fracture of right femur, subsequent encounter for closed fracture with routine healing: Secondary | ICD-10-CM | POA: Diagnosis not present

## 2019-12-13 DIAGNOSIS — R233 Spontaneous ecchymoses: Secondary | ICD-10-CM | POA: Diagnosis not present

## 2019-12-13 DIAGNOSIS — L821 Other seborrheic keratosis: Secondary | ICD-10-CM | POA: Diagnosis not present

## 2019-12-14 DIAGNOSIS — C342 Malignant neoplasm of middle lobe, bronchus or lung: Secondary | ICD-10-CM | POA: Diagnosis not present

## 2019-12-14 DIAGNOSIS — I129 Hypertensive chronic kidney disease with stage 1 through stage 4 chronic kidney disease, or unspecified chronic kidney disease: Secondary | ICD-10-CM | POA: Diagnosis not present

## 2019-12-14 DIAGNOSIS — S72141D Displaced intertrochanteric fracture of right femur, subsequent encounter for closed fracture with routine healing: Secondary | ICD-10-CM | POA: Diagnosis not present

## 2019-12-14 DIAGNOSIS — M810 Age-related osteoporosis without current pathological fracture: Secondary | ICD-10-CM | POA: Diagnosis not present

## 2019-12-14 DIAGNOSIS — C7801 Secondary malignant neoplasm of right lung: Secondary | ICD-10-CM | POA: Diagnosis not present

## 2019-12-14 DIAGNOSIS — I2699 Other pulmonary embolism without acute cor pulmonale: Secondary | ICD-10-CM | POA: Diagnosis not present

## 2019-12-15 ENCOUNTER — Encounter: Payer: Self-pay | Admitting: Pharmacist

## 2019-12-15 DIAGNOSIS — M8589 Other specified disorders of bone density and structure, multiple sites: Secondary | ICD-10-CM | POA: Insufficient documentation

## 2019-12-20 DIAGNOSIS — I129 Hypertensive chronic kidney disease with stage 1 through stage 4 chronic kidney disease, or unspecified chronic kidney disease: Secondary | ICD-10-CM | POA: Diagnosis not present

## 2019-12-20 DIAGNOSIS — I2699 Other pulmonary embolism without acute cor pulmonale: Secondary | ICD-10-CM | POA: Diagnosis not present

## 2019-12-20 DIAGNOSIS — M810 Age-related osteoporosis without current pathological fracture: Secondary | ICD-10-CM | POA: Diagnosis not present

## 2019-12-20 DIAGNOSIS — S72141D Displaced intertrochanteric fracture of right femur, subsequent encounter for closed fracture with routine healing: Secondary | ICD-10-CM | POA: Diagnosis not present

## 2019-12-20 DIAGNOSIS — C7801 Secondary malignant neoplasm of right lung: Secondary | ICD-10-CM | POA: Diagnosis not present

## 2019-12-20 DIAGNOSIS — C342 Malignant neoplasm of middle lobe, bronchus or lung: Secondary | ICD-10-CM | POA: Diagnosis not present

## 2019-12-23 ENCOUNTER — Inpatient Hospital Stay: Payer: Medicare Other | Attending: Oncology

## 2019-12-23 ENCOUNTER — Other Ambulatory Visit: Payer: Self-pay

## 2019-12-23 VITALS — BP 131/71 | HR 79 | Temp 98.3°F | Resp 18

## 2019-12-23 DIAGNOSIS — R970 Elevated carcinoembryonic antigen [CEA]: Secondary | ICD-10-CM | POA: Diagnosis not present

## 2019-12-23 DIAGNOSIS — C3481 Malignant neoplasm of overlapping sites of right bronchus and lung: Secondary | ICD-10-CM | POA: Diagnosis not present

## 2019-12-23 DIAGNOSIS — M810 Age-related osteoporosis without current pathological fracture: Secondary | ICD-10-CM | POA: Insufficient documentation

## 2019-12-23 DIAGNOSIS — M8589 Other specified disorders of bone density and structure, multiple sites: Secondary | ICD-10-CM

## 2019-12-23 DIAGNOSIS — Z853 Personal history of malignant neoplasm of breast: Secondary | ICD-10-CM | POA: Diagnosis not present

## 2019-12-23 DIAGNOSIS — C50312 Malignant neoplasm of lower-inner quadrant of left female breast: Secondary | ICD-10-CM | POA: Diagnosis not present

## 2019-12-23 MED ORDER — DENOSUMAB 60 MG/ML ~~LOC~~ SOSY
PREFILLED_SYRINGE | SUBCUTANEOUS | Status: AC
  Filled 2019-12-23: qty 1

## 2019-12-23 MED ORDER — DENOSUMAB 60 MG/ML ~~LOC~~ SOSY
60.0000 mg | PREFILLED_SYRINGE | Freq: Once | SUBCUTANEOUS | Status: AC
  Administered 2019-12-23: 60 mg via SUBCUTANEOUS

## 2019-12-23 NOTE — Patient Instructions (Signed)
Denosumab injection What is this medicine? DENOSUMAB (den oh sue mab) slows bone breakdown. Prolia is used to treat osteoporosis in women after menopause and in men, and in people who are taking corticosteroids for 6 months or more. Xgeva is used to treat a high calcium level due to cancer and to prevent bone fractures and other bone problems caused by multiple myeloma or cancer bone metastases. Xgeva is also used to treat giant cell tumor of the bone. This medicine may be used for other purposes; ask your health care provider or pharmacist if you have questions. COMMON BRAND NAME(S): Prolia, XGEVA What should I tell my health care provider before I take this medicine? They need to know if you have any of these conditions:  dental disease  having surgery or tooth extraction  infection  kidney disease  low levels of calcium or Vitamin D in the blood  malnutrition  on hemodialysis  skin conditions or sensitivity  thyroid or parathyroid disease  an unusual reaction to denosumab, other medicines, foods, dyes, or preservatives  pregnant or trying to get pregnant  breast-feeding How should I use this medicine? This medicine is for injection under the skin. It is given by a health care professional in a hospital or clinic setting. A special MedGuide will be given to you before each treatment. Be sure to read this information carefully each time. For Prolia, talk to your pediatrician regarding the use of this medicine in children. Special care may be needed. For Xgeva, talk to your pediatrician regarding the use of this medicine in children. While this drug may be prescribed for children as young as 13 years for selected conditions, precautions do apply. Overdosage: If you think you have taken too much of this medicine contact a poison control center or emergency room at once. NOTE: This medicine is only for you. Do not share this medicine with others. What if I miss a dose? It is  important not to miss your dose. Call your doctor or health care professional if you are unable to keep an appointment. What may interact with this medicine? Do not take this medicine with any of the following medications:  other medicines containing denosumab This medicine may also interact with the following medications:  medicines that lower your chance of fighting infection  steroid medicines like prednisone or cortisone This list may not describe all possible interactions. Give your health care provider a list of all the medicines, herbs, non-prescription drugs, or dietary supplements you use. Also tell them if you smoke, drink alcohol, or use illegal drugs. Some items may interact with your medicine. What should I watch for while using this medicine? Visit your doctor or health care professional for regular checks on your progress. Your doctor or health care professional may order blood tests and other tests to see how you are doing. Call your doctor or health care professional for advice if you get a fever, chills or sore throat, or other symptoms of a cold or flu. Do not treat yourself. This drug may decrease your body's ability to fight infection. Try to avoid being around people who are sick. You should make sure you get enough calcium and vitamin D while you are taking this medicine, unless your doctor tells you not to. Discuss the foods you eat and the vitamins you take with your health care professional. See your dentist regularly. Brush and floss your teeth as directed. Before you have any dental work done, tell your dentist you are   receiving this medicine. Do not become pregnant while taking this medicine or for 5 months after stopping it. Talk with your doctor or health care professional about your birth control options while taking this medicine. Women should inform their doctor if they wish to become pregnant or think they might be pregnant. There is a potential for serious side  effects to an unborn child. Talk to your health care professional or pharmacist for more information. What side effects may I notice from receiving this medicine? Side effects that you should report to your doctor or health care professional as soon as possible:  allergic reactions like skin rash, itching or hives, swelling of the face, lips, or tongue  bone pain  breathing problems  dizziness  jaw pain, especially after dental work  redness, blistering, peeling of the skin  signs and symptoms of infection like fever or chills; cough; sore throat; pain or trouble passing urine  signs of low calcium like fast heartbeat, muscle cramps or muscle pain; pain, tingling, numbness in the hands or feet; seizures  unusual bleeding or bruising  unusually weak or tired Side effects that usually do not require medical attention (report to your doctor or health care professional if they continue or are bothersome):  constipation  diarrhea  headache  joint pain  loss of appetite  muscle pain  runny nose  tiredness  upset stomach This list may not describe all possible side effects. Call your doctor for medical advice about side effects. You may report side effects to FDA at 1-800-FDA-1088. Where should I keep my medicine? This medicine is only given in a clinic, doctor's office, or other health care setting and will not be stored at home. NOTE: This sheet is a summary. It may not cover all possible information. If you have questions about this medicine, talk to your doctor, pharmacist, or health care provider.  2020 Elsevier/Gold Standard (2017-07-04 16:10:44)

## 2019-12-23 NOTE — Progress Notes (Signed)
Pt stable at time of discharge. 

## 2019-12-31 ENCOUNTER — Other Ambulatory Visit: Payer: Self-pay | Admitting: *Deleted

## 2019-12-31 NOTE — Patient Outreach (Signed)
[facility]) Care Management  12/31/2019  [place] [birth year] [id number]   Telephone Assessment-Successful (Cancer)  Previous plan of care related to FALLS. All goal and interventions successfully met with closure. Pt has indicated her cancer markers have changed and she may require a change in her oral chemo medication. States the current plan is for her to continue the current medication however may change on her next follow up appointment 01/07/2020. Pt denies nay falls with again all goals met. Adherence with all medications as reviewed today and sufficient transportation to all medical appointments. States she has some blood clots in her lungs and recently started on Xarelto with no additional or reported issues.   New plan of care generated related to Cancer and ongoing therapy. Discussed all barriers and coping skills. Pt denies any forms of depressions or stress with a strong family support system. All goals and interventions discussed as pt continues to request a monthly follow up call with this RN case manager for [facility] services. Reminded pt once again of other disciplines with [facility] for available needs (Education officer, museum and pharmacy). Pt continues to live alone however nearby family with primary caregiver Carmelia Roller Whitehair-daughter-in-law) available if needed.   Goals Addressed            This Visit's Progress   . COMPLETED: [facility] 12/03/2019 Update       CARE PLAN ENTRY (see longitudinal plan of care for additional care plan information)  Current Barriers:  Marland Kitchen Knowledge Deficits related to fall precautions . Decreased adherence to prescribed treatment for fall prevention . Knowledge Deficits related to prevention measures (falls)  Clinical Goal(s):  Marland Kitchen Over the next 45 days, patient will demonstrate improved adherence to prescribed treatment plan for decreasing falls as evidenced by patient reporting and review of EMR . Over the next 30 days, patient  will verbalize using fall risk reduction strategies discussed-30 day goal met . Over the next 30 days, patient will not experience additional falls-30 day goal met   Interventions:  . Provided written and verbal education re: Potential causes of falls and Fall prevention strategies . Reviewed medications and discussed potential side effects of medications such as dizziness and frequent urination . Assessed for s/s of orthostatic hypotension . Assessed for falls since last encounter. . Assessed patients knowledge of fall risk prevention secondary to previously provided education. . Assessed working status of life alert bracelet and patient adherence . Provided patient information for fall alert systems . Evaluation of current treatment plan related to fall pevention and patient's adherence to plan as established by provider. . Provided education to patient re: Nurse, adult on fall prevention and safety measures. . Discussed plans with patient for ongoing care management follow up and provided patient with direct contact information for care management team . Provided patient with emmi educational materials related to fall prevention . Reviewed scheduled/upcoming provider appointments including:   Patient Self Care Activities:  . Utilize walker/wheelchair/cane (assistive device) appropriately with all ambulation . De-clutter walkways . Change positions slowly . Wear secure fitting shoes at all times with ambulation . Utilize home lighting for dim lit areas . Have self and pet awareness at all times  Plan: . CCM RN CM will follow up in one month.   Initial goal documentation     . [facility]-Follow My Treatment Plan       Follow Up Date    - call for medicine refill 2 or 3 days before it runs out -  call the doctor or nurse before I stop taking medicine - call the doctor or nurse to get help with side effects - keep a list of all the medicines I take; vitamins and herbals  too - keep follow-up appointments    Why is this important?   Following your treatment plan will help keep your care on track.  Medicine may be the most important piece of your plan.  There are many reasons why you might want to stop taking medicine. You may get tired of taking your medicine. You may think medicine costs too much money. You may find the side effects are too much to bear.  Try some of these steps to make following the treatment plan a little easier.     Notes:     . [facility]-Make and Keep All Appointments       Follow Up Date 11/31/2021   - call to cancel if needed - keep a calendar with prescription refill dates - keep a calendar with appointment dates    Why is this important?   Part of staying healthy is seeing the doctor for follow-up care.  If you forget your appointments, there are some things you can do to stay on track.    Notes:        Raina Mina, RN Care Management Coordinator [place] Office [phone]

## 2020-01-07 DIAGNOSIS — M810 Age-related osteoporosis without current pathological fracture: Secondary | ICD-10-CM | POA: Diagnosis not present

## 2020-01-07 DIAGNOSIS — Z6823 Body mass index (BMI) 23.0-23.9, adult: Secondary | ICD-10-CM | POA: Diagnosis not present

## 2020-01-07 DIAGNOSIS — E039 Hypothyroidism, unspecified: Secondary | ICD-10-CM | POA: Diagnosis not present

## 2020-01-07 DIAGNOSIS — I10 Essential (primary) hypertension: Secondary | ICD-10-CM | POA: Diagnosis not present

## 2020-01-07 DIAGNOSIS — C3490 Malignant neoplasm of unspecified part of unspecified bronchus or lung: Secondary | ICD-10-CM | POA: Diagnosis not present

## 2020-01-07 DIAGNOSIS — E7800 Pure hypercholesterolemia, unspecified: Secondary | ICD-10-CM | POA: Diagnosis not present

## 2020-01-07 DIAGNOSIS — N1832 Chronic kidney disease, stage 3b: Secondary | ICD-10-CM | POA: Diagnosis not present

## 2020-01-07 DIAGNOSIS — M069 Rheumatoid arthritis, unspecified: Secondary | ICD-10-CM | POA: Diagnosis not present

## 2020-01-07 DIAGNOSIS — K219 Gastro-esophageal reflux disease without esophagitis: Secondary | ICD-10-CM | POA: Diagnosis not present

## 2020-01-07 DIAGNOSIS — Z0000 Encounter for general adult medical examination without abnormal findings: Secondary | ICD-10-CM | POA: Diagnosis not present

## 2020-01-07 DIAGNOSIS — M8949 Other hypertrophic osteoarthropathy, multiple sites: Secondary | ICD-10-CM | POA: Diagnosis not present

## 2020-01-07 DIAGNOSIS — I2782 Chronic pulmonary embolism: Secondary | ICD-10-CM | POA: Diagnosis not present

## 2020-01-13 ENCOUNTER — Other Ambulatory Visit: Payer: Self-pay | Admitting: Hematology and Oncology

## 2020-01-13 DIAGNOSIS — C342 Malignant neoplasm of middle lobe, bronchus or lung: Secondary | ICD-10-CM

## 2020-01-22 DIAGNOSIS — Z23 Encounter for immunization: Secondary | ICD-10-CM | POA: Diagnosis not present

## 2020-01-24 NOTE — Progress Notes (Signed)
[facility]  [address] [place],  [place]  [address] [phone]  Clinic Day:  01/26/2020  Referring physician: Townsend Roger, MD   This document serves as a record of services personally performed by Hosie Poisson, MD. It was created on their behalf by [facility], a trained medical scribe. The creation of this record is based on the scribe's personal observations and the provider's statements to them.   CHIEF COMPLAINT:  CC: Stage IVA non-small cell lung cancer and history of stage IA hormone receptor positive left breast cancer  Current Treatment:  osimertinib and denosumab   HISTORY OF PRESENT ILLNESS:  Marilyn Richard is a 86 y.o. female with stage IA (T1b N0 M0) hormone receptor positive left breast cancer diagnosed in April 2015.  She was treated with lumpectomy.  Pathology revealed a 0.8 cm, grade 1, invasive carcinoma with both ductal and lobular features and 2 negative sentinel nodes.  Estrogen receptors were positive, progesterone receptors negative and her 2 Neu negative.  She was placed on raloxifene in May 2015.  She has osteopenia, which was stable on bone density scan in January 2016.  We previously recommended testing for hereditary breast and ovarian cancer and at her last visit she agreed to testing.  She underwent testing with [facility] myRisk Hereditary Cancer Panel test which did not reveal any clinically significant mutation or variants of uncertain significance. She had a right knee replacement in April 2017 and states she still has some problems with pain of the right knee, for which Tylenol is effective.  Bone density scan in August 2018 revealed osteopenia, which was just mildly worse by 3%.  She had a persistent respiratory infection and chest X-ray in May revealed possible pulmonary nodule.  She then had a CT chest, which revealed multiple lung lesions and enlarged lymph nodes.  PET scan in June revealed a  hypermetabolic right middle lobe mass with multiple hypermetabolic right upper lobe pulmonary nodules consistent with bronchogenic carcinoma.  There was also hypermetabolic mediastinal lymph nodes in the subcarinal region with multiple lesions of the right pleural space and a hypermetabolic lymph node adjacent to the GE junction.  There was no other metastatic disease in the abdomen.  There was a single hypermetabolic focus in the S1 vertebral body.  CT guided biopsy of the lung was done in June.  Pathology revealed non-small cell carcinoma consistent with primary lung adenocarcinoma.  She therefore has a stage IVA (T3 N3 M1c) non-small cell lung cancer.  PDL1 was positive.  Foundation 1 testing revealed positive EGFR mutation, [id number].  Due to the EGFR mutation, we recommended palliative oral chemotherapy with osimertinib.  She had a repeat CT chest in July, which revealed significant worsening, so this is a more aggressive lung cancer than we originally thought.  The right middle lobe mass had increased from 3 cm to 4.9 cm in less than 2 months time and she had increased pulmonary and pleural based metastases as well as a new moderate sized right pleural effusion and progressive thoracic adenopathy.  She was declining rapidly.  Also, the CEA had nearly doubled in a short time up to 279 in July.  Due to her advanced age, we started osimertinib 40 mg once daily on July 20th, which is 50% of the standard dose.  Since starting osimertinib, she had improvement in her breathing and right chest pain.  She tolerated osimertinib 40 mg daily well, so it was increased to twice daily  on August 3rd.  She has had intermittent diarrhea and hyponatremia during therapy. She also had mild leukopenia and anemia.  She was seen on August 26th due to persistent diarrhea despite increasing Lomotil to 6 tablets daily. Stool was positive for for Campylobacter.  The patient was placed on azithromycin 500 mg daily for 5 days.  Due to  dehydration, Dr. Nona Dell changed her losartan/hydrochlorothiazide to losartan alone.  We resumed osimertinib 40 mg daily in September.  The CEA had decreased to 77 by that time.    When she was seen on October 28th, 2020, she had worsening rash especially of the face and neck.  She was placed on minocycline 100 mg twice daily, as well as clindamycin gel to use topically 3 times daily as needed for her rash which is associated with osimertinib.  The CEA had decreased to 35. CT chest, abdomen and pelvis in December of 2020 revealed marked improvement of the prior right middle lobe mass now measuring 9 x 12 mm, previously 3.0 x 4.9 cm and just mild residual right pleural effusion.  There was no evidence of metastatic disease.  The CEA was down to 5.2 in February.  We asked her to start calcium 600 mg daily for osteopenia.  Annual bilateral mammogram from April 8th was clear.  CT chest, abdomen and pelvis from April 15th revealed the nodule of the medial segment right middle lobe to be stable at 1.3 x 0.9 cm.  CEA in April was 5.3, and mildly increased to 5.6 in May. She fractured her right hip back in June and underwent surgery.  Bone density scan from June 14th revealed osteoporosis of the right forearm with a T-score of -2.9.  The left femur neck measures -1.7, previously -1.6, and considered osteopenic.  Dual femur total mean measures -0.9, previously -0.5, and considered normal.  CT chest from September 7th revealed moderate burden of bilateral upper and lower lobe pulmonary emboli.  There are no findings for significant right heart strain.  She has stable to slightly smaller right middle lobe pulmonary lesion, now measuring 12 x 8.5 mm, previously 13 x 9 mm.  She was contacted to begin anticoagulation ASAP, but preferred not to go with Lovenox injections.  She was placed on Xarelto 15 mg twice daily.  CEA was 15.5, down from 16.5 in August.  She completed 5 years of raloxifene so this was discontinued in  September.   She was placed on Prolia and had her 1st dose in October.   INTERVAL HISTORY:  Marilyn Richard is here for routine follow up and states that she has been doing well.  She continues osimertinib twice daily.  She has received her COVID vaccine booster.  She received her Prolia last month, and will be due again in April.  Her  appetite is good, and she has gained 3 pounds since her last visit.  She denies fever, chills or other signs of infection.  She denies nausea, vomiting, bowel issues, or abdominal pain.  She denies sore throat, cough, wheezing, dyspnea, or chest pain.   REVIEW OF SYSTEMS:  Review of Systems  Skin: Positive for rash (stable).  All other systems reviewed and are negative.    VITALS:  Blood pressure (!) 158/68, pulse 71, temperature 98.1 F (36.7 C), temperature source Oral, resp. rate 18, height 5' (1.524 m), weight 122 lb 14.4 oz (55.7 kg), SpO2 98 %.  Wt Readings from Last 3 Encounters:  01/26/20 122 lb 14.4 oz (55.7 kg)  12/17/17 141 lb 12.8 oz (64.3 kg)  06/04/17 142 lb (64.4 kg)    Body mass index is 24 kg/m.  Performance status (ECOG): 1 - Symptomatic but completely ambulatory  PHYSICAL EXAM:  Physical Exam Constitutional:      General: She is not in acute distress.    Appearance: Normal appearance. She is normal weight.  HENT:     Head: Normocephalic and atraumatic.  Eyes:     General: No scleral icterus.    Extraocular Movements: Extraocular movements intact.     Conjunctiva/sclera: Conjunctivae normal.     Pupils: Pupils are equal, round, and reactive to light.  Cardiovascular:     Rate and Rhythm: Normal rate and regular rhythm.     Pulses: Normal pulses.     Heart sounds: Normal heart sounds. No murmur heard.  No friction rub. No gallop.   Pulmonary:     Effort: Pulmonary effort is normal. No respiratory distress.     Breath sounds: Normal breath sounds.  Abdominal:     General: Bowel sounds are normal. There is no distension.      Palpations: Abdomen is soft. There is no mass.     Tenderness: There is no abdominal tenderness.  Musculoskeletal:        General: Normal range of motion.     Cervical back: Normal range of motion and neck supple.     Right lower leg: No edema.     Left lower leg: No edema.  Lymphadenopathy:     Cervical: No cervical adenopathy.  Skin:    General: Skin is warm and dry.     Comments: Mild erythema and mild skin changes of the face and upper extremities with associated ecchymoses.  Neurological:     General: No focal deficit present.     Mental Status: She is alert and oriented to person, place, and time. Mental status is at baseline.  Psychiatric:        Mood and Affect: Mood normal.        Behavior: Behavior normal.        Thought Content: Thought content normal.        Judgment: Judgment normal.     LABS:   CBC Latest Ref Rng & Units 10/30/2017 01/07/2017 08/22/2016  WBC 3.8 - 10.8 Thousand/uL 6.2 6.3 5.8  Hemoglobin 11.7 - 15.5 g/dL 12.4 12.8 12.6  Hematocrit 35 - 45 % 36.7 38.0 39.1  Platelets 140 - 400 Thousand/uL 214 236 252   CMP Latest Ref Rng & Units 10/30/2017 01/07/2017 08/22/2016  Glucose 65 - 99 mg/dL 86 78 104(H)  BUN 7 - 25 mg/dL '22 20 21  ' Creatinine 0.60 - 0.88 mg/dL 1.03(H) 0.84 1.08(H)  Sodium 135 - 146 mmol/L 136 135 135  Potassium 3.5 - 5.3 mmol/L 4.4 4.7 3.7  Chloride 98 - 110 mmol/L 100 99 98  CO2 20 - 32 mmol/L '25 27 25  ' Calcium 8.6 - 10.4 mg/dL 9.4 9.4 9.0  Total Protein 6.1 - 8.1 g/dL 6.0(L) 6.3 5.9(L)  Total Bilirubin 0.2 - 1.2 mg/dL 0.4 0.5 0.4  Alkaline Phos 33 - 130 U/L - - 31(L)  AST 10 - 35 U/L '17 16 17  ' ALT 6 - 29 U/L '13 12 13     ' No results found for: CEA1 / No results found for: CEA1  STUDIES:  No results found.   Allergies:  Allergies  Allergen Reactions  . Wasp Venom Anaphylaxis  . Keflex [Cephalexin] Rash  . Levofloxacin In  D5w Other (See Comments)    Caused issues with muscles in her arms.  Caused issues with muscles in her  arms.   Tiajuana Amass [Loracarbef] Hives    Current Medications: Current Outpatient Medications  Medication Sig Dispense Refill  . acetaminophen (TYLENOL 8 HOUR) 650 MG CR tablet Take 500 mg by mouth every 8 (eight) hours as needed for pain. 2-52m twice a day    . ARTIFICIAL TEAR OP Apply to eye.    . Ascorbic Acid (VITAMIN C WITH ROSE HIPS) 500 MG tablet Take 500 mg by mouth daily.    . citalopram (CELEXA) 10 MG tablet Take 10 mg by mouth daily.    . diclofenac sodium (VOLTAREN) 1 % GEL 3 grams tid prn 3 Tube 3  . fenofibrate (TRICOR) 48 MG tablet Take 48 mg by mouth daily.    . fluticasone (FLONASE) 50 MCG/ACT nasal spray Place into both nostrils as needed.     . hydrochlorothiazide (HYDRODIURIL) 25 MG tablet Take 25 mg by mouth daily.    .Marland Kitchenlevothyroxine (SYNTHROID) 50 MCG tablet Take 50 mcg by mouth daily.    .Marland Kitchenlosartan (COZAAR) 100 MG tablet Take 100 mg by mouth daily.    . meclizine (ANTIVERT) 25 MG tablet Take by mouth as needed.     . minocycline (MINOCIN) 100 MG capsule Take 100 mg by mouth 2 (two) times daily as needed.     . Multiple Vitamins-Minerals (MULTIVITAMIN WITH MINERALS) tablet Take 1 tablet by mouth daily.    .Marland Kitchenosimertinib mesylate (TAGRISSO) 40 MG tablet Take 40 mg by mouth in the morning and at bedtime.    . rivaroxaban (XARELTO) 20 MG TABS tablet Take 15 mg by mouth daily with supper. 15 mg twice daily    . Vitamin D, Cholecalciferol, 1000 units CAPS Take 2,000 Units by mouth daily.     . vitamin E 400 UNIT capsule Take 400 Units by mouth daily.     No current facility-administered medications for this visit.     ASSESSMENT & PLAN:   Assessment:   1. History of stage IA hormone receptor positive left breast cancer April 2015 treated with lumpectomy.  She was placed on raloxifene was on that for over 5 years.  She has discontinued this oral therapy.  2. Osteoporosis.  Her last bone density from June 2021 revealed worsening and she is now considered osteoporotic.   She continues raloxifene, and we discussed further treatment of her bones today.  She has been placed on Prolia every 6 months, and received her 1st dose in October.  She will be due again in April.  3. Stage IVA non-small cell lung cancer, which is most consistent with adenocarcinoma.  EGFR and PDL1 were positive.  She is on palliative oral chemotherapy with osimertinib 40 mg twice daily.  The CEA had decreased steadily, but has now increased.  She had an excellent response on last CT imaging.     4. Right pleural effusion, resolved.  5. Hyponatremia, stable.   6. Depression, improved with increasing citalopram.    7. Previous Campylobacter infection.  She remains without evidence of recurrence  8. Chronic low back and hip pain, which in part may be secondary to the S1 metastasis, which is stable and controlled with Voltaren gel and Tylenol.    9. Rash felt to be secondary to osimertinib.  She has minimal rash today.  10. Recent hip fracture in June requiring surgery.  She is getting around well with the  assistance of a walker.    11. Thrombocytopenia, improved.  B12 and folate were normal.  12. Bilateral upper and lower lobe pulmonary emboli, without significant right heart strain.  She has since been placed on Xarelto starting at 15 mg twice daily, now taking 20 mg once daily.  I explained that she will likely need to stay on this medication for the rest of her life as she will be at a higher risk for thrombosis.   Plan: We are concerned about her rising CEA, and discussed alternative therapies today if her current treatment is found to be unsuccessful.  The patient does not wish to pursue IV chemotherapy, but another alternative would be immunotherapy, as she is PDL1 positive, or a combination of oral afatinib and systemic cetuximab monoclonal antibody therapy.  For now, she knows to continue with twice daily osimertinib.  Thankfully, she is asymptomatic and physical exam is good.  I will  call her with her lab results today.  We will see her back in 1 month with CBC, CMP, CEA and CT chest for evaluation.  Patient and her daughter-in-law all verbalized understanding of and agreement to the plan.  She knows to call the office should any new questions or concerns arise.   I provided 30 minutes of face-to-face time during this this encounter and > 50% was spent counseling as documented under my assessment and plan.    Derwood Kaplan, MD [facility] AT [facility] [address] [place] [address] Dept: [phone] Dept Fax: [phone]   I, Rita Ohara, am acting as scribe for Derwood Kaplan, MD  I have reviewed this report as typed by the medical scribe, and it is complete and accurate.

## 2020-01-26 ENCOUNTER — Inpatient Hospital Stay: Payer: Medicare Other | Attending: Oncology

## 2020-01-26 ENCOUNTER — Inpatient Hospital Stay (INDEPENDENT_AMBULATORY_CARE_PROVIDER_SITE_OTHER): Payer: Medicare Other | Admitting: Oncology

## 2020-01-26 ENCOUNTER — Other Ambulatory Visit: Payer: Self-pay

## 2020-01-26 ENCOUNTER — Encounter: Payer: Self-pay | Admitting: Oncology

## 2020-01-26 ENCOUNTER — Telehealth: Payer: Self-pay | Admitting: Oncology

## 2020-01-26 ENCOUNTER — Other Ambulatory Visit: Payer: Self-pay | Admitting: Oncology

## 2020-01-26 DIAGNOSIS — F32A Depression, unspecified: Secondary | ICD-10-CM

## 2020-01-26 DIAGNOSIS — Z79899 Other long term (current) drug therapy: Secondary | ICD-10-CM

## 2020-01-26 DIAGNOSIS — E871 Hypo-osmolality and hyponatremia: Secondary | ICD-10-CM

## 2020-01-26 DIAGNOSIS — C782 Secondary malignant neoplasm of pleura: Secondary | ICD-10-CM

## 2020-01-26 DIAGNOSIS — D696 Thrombocytopenia, unspecified: Secondary | ICD-10-CM

## 2020-01-26 DIAGNOSIS — M818 Other osteoporosis without current pathological fracture: Secondary | ICD-10-CM

## 2020-01-26 DIAGNOSIS — C342 Malignant neoplasm of middle lobe, bronchus or lung: Secondary | ICD-10-CM

## 2020-01-26 DIAGNOSIS — C3491 Malignant neoplasm of unspecified part of right bronchus or lung: Secondary | ICD-10-CM

## 2020-01-26 DIAGNOSIS — Z853 Personal history of malignant neoplasm of breast: Secondary | ICD-10-CM | POA: Insufficient documentation

## 2020-01-26 DIAGNOSIS — R21 Rash and other nonspecific skin eruption: Secondary | ICD-10-CM | POA: Insufficient documentation

## 2020-01-26 LAB — CBC WITH DIFFERENTIAL (CANCER CENTER ONLY)
Abs Immature Granulocytes: 0.02 10*3/uL (ref 0.00–0.07)
Basophils Absolute: 0 10*3/uL (ref 0.0–0.1)
Basophils Relative: 0 %
Eosinophils Absolute: 0 10*3/uL (ref 0.0–0.5)
Eosinophils Relative: 1 %
HCT: 37.6 % (ref 36.0–46.0)
Hemoglobin: 11.8 g/dL — ABNORMAL LOW (ref 12.0–15.0)
Immature Granulocytes: 0 %
Lymphocytes Relative: 29 %
Lymphs Abs: 1.3 10*3/uL (ref 0.7–4.0)
MCH: 30.5 pg (ref 26.0–34.0)
MCHC: 31.4 g/dL (ref 30.0–36.0)
MCV: 97.2 fL (ref 80.0–100.0)
Monocytes Absolute: 0.3 10*3/uL (ref 0.1–1.0)
Monocytes Relative: 7 %
Neutro Abs: 2.8 10*3/uL (ref 1.7–7.7)
Neutrophils Relative %: 63 %
Platelet Count: 149 10*3/uL — ABNORMAL LOW (ref 150–400)
RBC: 3.87 MIL/uL (ref 3.87–5.11)
RDW: 14 % (ref 11.5–15.5)
WBC Count: 4.5 10*3/uL (ref 4.0–10.5)
nRBC: 0 % (ref 0.0–0.2)

## 2020-01-26 LAB — CMP (CANCER CENTER ONLY)
ALT: 15 U/L (ref 0–44)
AST: 22 U/L (ref 15–41)
Albumin: 4 g/dL (ref 3.5–5.0)
Alkaline Phosphatase: 34 U/L — ABNORMAL LOW (ref 38–126)
Anion gap: 9 (ref 5–15)
BUN: 22 mg/dL (ref 8–23)
CO2: 24 mmol/L (ref 22–32)
Calcium: 8.7 mg/dL — ABNORMAL LOW (ref 8.9–10.3)
Chloride: 102 mmol/L (ref 98–111)
Creatinine: 0.93 mg/dL (ref 0.44–1.00)
GFR, Estimated: 58 mL/min — ABNORMAL LOW (ref >60)
Glucose, Bld: 89 mg/dL (ref 70–99)
Potassium: 4.1 mmol/L (ref 3.5–5.1)
Sodium: 135 mmol/L (ref 135–145)
Total Bilirubin: 0.5 mg/dL (ref 0.3–1.2)
Total Protein: 6.5 g/dL (ref 6.5–8.1)

## 2020-01-26 NOTE — Telephone Encounter (Signed)
Per 11/17 LOS, patient scheduled for 12/14 Labs, CT Chest, 12/16 Follow Up

## 2020-01-26 NOTE — Telephone Encounter (Signed)
Per 11/17 LOS, patient scheduled for 12/14 Labs, CT Chest, 12/16 Follow up - Gave patient's daughter order/Appt Summary

## 2020-01-27 LAB — CEA: CEA: 68.8 ng/mL — ABNORMAL HIGH (ref 0.0–4.7)

## 2020-01-31 ENCOUNTER — Other Ambulatory Visit: Payer: Self-pay | Admitting: *Deleted

## 2020-01-31 NOTE — Progress Notes (Signed)
Called and spoke with patient about reenrolling for free Tagrisso through [employer]. We can complete this process online, she asked that I call and speak with her daughter in law Malasha Kleppe. Completed reenrollment with [name] over the phone. I will mail a copy to patient for her records.

## 2020-01-31 NOTE — Patient Outreach (Signed)
[facility]) Care Management  01/31/2020  Marilyn Richard [birth year] [id number]   Telephone Assessment-Successful  RN spoke with pt today and received an update on her ongonig management of care. Reports no falls as she continues to use her rollator when ambulating both inside and outside the home. Pt states her daughter-in-law who lives next door continue to check on her daily. Pt also states she has a medical alert emergency device if needed. Verified pt continue to have sufficient transportation services for all her medical appointments and sufficient supply on all her medications with no needed refills.   Pt recent started on Xarelto due to blood clots that she will have a repeat scan in Dec on the effectiveness of this medication. Recent cancer report continue to be pending awaiting lab to finalize the report.  Continue to review and discuss the goals and interventions on the current plan of care. Will continue to encourage adherence to all discussed today. Will update provider quarterly on pt's progress and remain available for any inquires or questions related to [facility] services. Pt very appreciative and grateful for the services. Will follow up later part of Dec to allow review of pending scans/test.  Goals Addressed            This Visit's Progress   . THN-Follow My Treatment Plan   On track    Follow Up Date 03/10/2020   - call for medicine refill 2 or 3 days before it runs out - call the doctor or nurse before I stop taking medicine - call the doctor or nurse to get help with side effects - keep a list of all the medicines I take; vitamins and herbals too - keep follow-up appointments    Why is this important?   Following your treatment plan will help keep your care on track.  Medicine may be the most important piece of your plan.  There are many reasons why you might want to stop taking medicine. You may get tired of taking your medicine.  You may think medicine costs too much money. You may find the side effects are too much to bear.  Try some of these steps to make following the treatment plan a little easier.     Notes:     . THN-Make and Keep All Appointments   On track    Follow Up Date 03/10/2020   - call to cancel if needed - keep a calendar with prescription refill dates - keep a calendar with appointment dates    Why is this important?   Part of staying healthy is seeing the doctor for follow-up care.  If you forget your appointments, there are some things you can do to stay on track.    Notes: Will extend pending medical appointments        Raina Mina, RN Care Management Coordinator [place] Office [phone]

## 2020-02-22 ENCOUNTER — Telehealth: Payer: Self-pay | Admitting: Oncology

## 2020-02-22 ENCOUNTER — Other Ambulatory Visit: Payer: Self-pay | Admitting: Hematology and Oncology

## 2020-02-22 DIAGNOSIS — I700 Atherosclerosis of aorta: Secondary | ICD-10-CM | POA: Diagnosis not present

## 2020-02-22 DIAGNOSIS — C50919 Malignant neoplasm of unspecified site of unspecified female breast: Secondary | ICD-10-CM | POA: Diagnosis not present

## 2020-02-22 DIAGNOSIS — I2510 Atherosclerotic heart disease of native coronary artery without angina pectoris: Secondary | ICD-10-CM | POA: Diagnosis not present

## 2020-02-22 DIAGNOSIS — C342 Malignant neoplasm of middle lobe, bronchus or lung: Secondary | ICD-10-CM | POA: Diagnosis not present

## 2020-02-22 DIAGNOSIS — R978 Other abnormal tumor markers: Secondary | ICD-10-CM | POA: Diagnosis not present

## 2020-02-22 DIAGNOSIS — C50312 Malignant neoplasm of lower-inner quadrant of left female breast: Secondary | ICD-10-CM | POA: Diagnosis not present

## 2020-02-22 LAB — COMPREHENSIVE METABOLIC PANEL
Albumin: 4.3 (ref 3.5–5.0)
Calcium: 9.2 (ref 8.7–10.7)

## 2020-02-22 LAB — CBC AND DIFFERENTIAL
HCT: 37 (ref 36–46)
Hemoglobin: 12.3 (ref 12.0–16.0)
Neutrophils Absolute: 2.88
Platelets: 131 — AB (ref 150–399)
WBC: 4.3

## 2020-02-22 LAB — BASIC METABOLIC PANEL
BUN: 18 (ref 4–21)
CO2: 24 — AB (ref 13–22)
Chloride: 101 (ref 99–108)
Creatinine: 1 (ref 0.5–1.1)
Glucose: 90
Potassium: 4.2 (ref 3.4–5.3)
Sodium: 134 — AB (ref 137–147)

## 2020-02-22 LAB — HEPATIC FUNCTION PANEL
ALT: 14 (ref 7–35)
AST: 28 (ref 13–35)
Alkaline Phosphatase: 39 (ref 25–125)
Bilirubin, Total: 0.4

## 2020-02-22 LAB — CBC: RBC: 4.04 (ref 3.87–5.11)

## 2020-02-22 NOTE — Telephone Encounter (Signed)
Patient called to Confirm 12/16 Appt

## 2020-02-23 NOTE — Progress Notes (Signed)
[facility]  [address] [place],  [place]  [address] [phone]  Clinic Day:  02/24/2020  Referring physician: Townsend Roger, MD   This document serves as a record of services personally performed by Hosie Poisson, MD. It was created on their behalf by [facility], a trained medical scribe. The creation of this record is based on the scribe's personal observations and the provider's statements to them.   CHIEF COMPLAINT:  CC: Stage IVA non-small cell lung cancer and history of stage IA hormone receptor positive left breast cancer  Current Treatment:  osimertinib and denosumab   HISTORY OF PRESENT ILLNESS:  Marilyn Richard is a 86 y.o. female with stage IA (T1b N0 M0) hormone receptor positive left breast cancer diagnosed in April 2015.  She was treated with lumpectomy.  Pathology revealed a 0.8 cm, grade 1, invasive carcinoma with both ductal and lobular features and 2 negative sentinel nodes.  Estrogen receptors were positive, progesterone receptors negative and her 2 Neu negative.  She was placed on raloxifene in May 2015.  She has osteopenia, which was stable on bone density scan in January 2016.  We previously recommended testing for hereditary breast and ovarian cancer and at her last visit she agreed to testing.  She underwent testing with [facility] myRisk Hereditary Cancer Panel test which did not reveal any clinically significant mutation or variants of uncertain significance. She had a right knee replacement in April 2017 and states she still has some problems with pain of the right knee, for which Tylenol is effective.  Bone density scan in August 2018 revealed osteopenia, which was just mildly worse by 3%.  She had a persistent respiratory infection and chest X-ray in May revealed possible pulmonary nodule.  She then had a CT chest, which revealed multiple lung lesions and enlarged lymph nodes.  PET scan in June revealed a  hypermetabolic right middle lobe mass with multiple hypermetabolic right upper lobe pulmonary nodules consistent with bronchogenic carcinoma.  There was also hypermetabolic mediastinal lymph nodes in the subcarinal region with multiple lesions of the right pleural space and a hypermetabolic lymph node adjacent to the GE junction.  There was no other metastatic disease in the abdomen.  There was a single hypermetabolic focus in the S1 vertebral body.  CT guided biopsy of the lung was done in June.  Pathology revealed non-small cell carcinoma consistent with primary lung adenocarcinoma.  She therefore has a stage IVA (T3 N3 M1c) non-small cell lung cancer.  PDL1 was positive.  Foundation 1 testing revealed positive EGFR mutation, [id number].  Due to the EGFR mutation, we recommended palliative oral chemotherapy with osimertinib.  She had a repeat CT chest in July, which revealed significant worsening, so this is a more aggressive lung cancer than we originally thought.  The right middle lobe mass had increased from 3 cm to 4.9 cm in less than 2 months time and she had increased pulmonary and pleural based metastases as well as a new moderate sized right pleural effusion and progressive thoracic adenopathy.  She was declining rapidly.  Also, the CEA had nearly doubled in a short time up to 279 in July.  Due to her advanced age, we started osimertinib 40 mg once daily on July 20th, which is 50% of the standard dose.  Since starting osimertinib, she had improvement in her breathing and right chest pain.  She tolerated osimertinib 40 mg daily well, so it was increased to twice daily  on August 3rd.  She has had intermittent diarrhea and hyponatremia during therapy. She also had mild leukopenia and anemia.  She was seen on August 26th due to persistent diarrhea despite increasing Lomotil to 6 tablets daily. Stool was positive for for Campylobacter.  The patient was placed on azithromycin 500 mg daily for 5 days.  Due to  dehydration, Dr. Nona Dell changed her losartan/hydrochlorothiazide to losartan alone.  We resumed osimertinib 40 mg daily in September.  The CEA had decreased to 77 by that time.    When she was seen on October 28th, 2020, she had worsening rash especially of the face and neck.  She was placed on minocycline 100 mg twice daily, as well as clindamycin gel to use topically 3 times daily as needed for her rash which is associated with osimertinib.  The CEA had decreased to 35. CT chest, abdomen and pelvis in December of 2020 revealed marked improvement of the prior right middle lobe mass now measuring 9 x 12 mm, previously 3.0 x 4.9 cm and just mild residual right pleural effusion.  There was no evidence of metastatic disease.  The CEA was down to 5.2 in February.  We asked her to start calcium 600 mg daily for osteopenia.  Annual bilateral mammogram from April 8th was clear.  CT chest, abdomen and pelvis from April 15th revealed the nodule of the medial segment right middle lobe to be stable at 1.3 x 0.9 cm.  CEA in April was 5.3, and mildly increased to 5.6 in May. She fractured her right hip back in June and underwent surgery.  Bone density scan from June 14th revealed osteoporosis of the right forearm with a T-score of -2.9.  The left femur neck measures -1.7, previously -1.6, and considered osteopenic.  Dual femur total mean measures -0.9, previously -0.5, and considered normal.  CT chest from September 7th revealed moderate burden of bilateral upper and lower lobe pulmonary emboli.  There are no findings for significant right heart strain.  She has stable to slightly smaller right middle lobe pulmonary lesion, now measuring 12 x 8.5 mm, previously 13 x 9 mm.  She was contacted to begin anticoagulation ASAP, but preferred not to go with Lovenox injections.  She was placed on Xarelto 15 mg twice daily.  CEA was 15.5, down from 16.5 in August.  She completed 5 years of raloxifene so this was discontinued in  September.   She was placed on Prolia and had her 1st dose in October.  Her CEA increased to 37 in October and then 68.8 in November.    INTERVAL HISTORY:  Marilyn Richard is here for routine follow up and states that she has been well.  She continues osimertinib twice daily without difficulty.  She does have a hemorrhage of the right eye which arose last night.  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  CT chest from December 14th revealed interval resolution of the peripheral left upper lobe ground-glass opacity identified as new on the previous exam.  There is no substantial change medial right middle lobe pulmonary lesion measuring 14 mm, previously 12 mm, and no new suspicious pulmonary nodule or mass.  Labs revealed a normal CBC and CMP was unremarkable.  CEA was 107.0, previously 68.8 in November.  Her  appetite is good, and she has gained 2 pounds since her last visit.  She denies fever, chills or other signs of infection.  She denies nausea, vomiting,  bowel issues, or abdominal pain.  She denies sore throat, cough, dyspnea, or chest pain.  REVIEW OF SYSTEMS:  Review of Systems  Constitutional: Negative.   HENT:  Negative.   Eyes: Negative.        Hemorrhage of the right eye  Respiratory: Negative.   Cardiovascular: Negative.   Gastrointestinal: Negative.   Endocrine: Negative.   Genitourinary: Negative.    Musculoskeletal: Negative.   Skin: Negative.   Neurological: Negative.   Hematological: Bruises/bleeds easily.  Psychiatric/Behavioral: Negative.      VITALS:  Blood pressure (!) 186/79, pulse 69, temperature 98.2 F (36.8 C), temperature source Oral, resp. rate 18, height 5' (1.524 m), weight 124 lb 12.8 oz (56.6 kg), SpO2 96 %.  Wt Readings from Last 3 Encounters:  02/24/20 124 lb 12.8 oz (56.6 kg)  01/26/20 122 lb 14.4 oz (55.7 kg)  12/17/17 141 lb 12.8 oz (64.3 kg)    Body mass index is 24.37 kg/m.  Performance status (ECOG): 1 -  Symptomatic but completely ambulatory  PHYSICAL EXAM:  Physical Exam Constitutional:      General: She is not in acute distress.    Appearance: Normal appearance. She is normal weight.  HENT:     Head: Normocephalic and atraumatic.  Eyes:     General: No scleral icterus.    Extraocular Movements: Extraocular movements intact.     Conjunctiva/sclera: Conjunctivae normal.     Pupils: Pupils are equal, round, and reactive to light.     Comments: She has a conjunctival hemorrhage of the right eye.  Cardiovascular:     Rate and Rhythm: Normal rate and regular rhythm.     Pulses: Normal pulses.     Heart sounds: Normal heart sounds. No murmur heard. No friction rub. No gallop.   Pulmonary:     Effort: Pulmonary effort is normal. No respiratory distress.     Breath sounds: Normal breath sounds.  Abdominal:     General: Bowel sounds are normal. There is no distension.     Palpations: Abdomen is soft. There is no mass.     Tenderness: There is no abdominal tenderness.  Musculoskeletal:        General: Normal range of motion.     Cervical back: Normal range of motion and neck supple.     Right lower leg: No edema.     Left lower leg: No edema.  Lymphadenopathy:     Cervical: No cervical adenopathy.  Skin:    General: Skin is warm and dry.  Neurological:     General: No focal deficit present.     Mental Status: She is alert and oriented to person, place, and time. Mental status is at baseline.  Psychiatric:        Mood and Affect: Mood normal.        Behavior: Behavior normal.        Thought Content: Thought content normal.        Judgment: Judgment normal.     LABS:   CBC Latest Ref Rng & Units 02/22/2020 01/26/2020 10/30/2017  WBC - 4.3 4.5 6.2  Hemoglobin 12.0 - 16.0 12.8 11.8(L) 12.4  Hematocrit 36 - 46 37 37.6 36.7  Platelets 150 - 399 131(A) 149(L) 214   CMP Latest Ref Rng & Units 02/22/2020 01/26/2020 10/30/2017  Glucose 70 - 99 mg/dL - 89 86  BUN 4 - '21 18 22 22   ' Creatinine 0.5 - 1.1 1.0 0.93 1.03(H)  Sodium 137 - 147 134(A) 135  136  Potassium 3.4 - 5.3 4.2 4.1 4.4  Chloride 99 - 108 101 102 100  CO2 13 - 22 24(A) 24 25  Calcium 8.7 - 10.7 9.2 8.7(L) 9.4  Total Protein 6.5 - 8.1 g/dL - 6.5 6.0(L)  Total Bilirubin 0.3 - 1.2 mg/dL - 0.5 0.4  Alkaline Phos 25 - 125 39 34(L) -  AST 13 - 35 '28 22 17  ' ALT 7 - 35 '14 15 13     ' Lab Results  Component Value Date   CEA1 68.8 (H) 01/26/2020   /  CEA  Date Value Ref Range Status  01/26/2020 68.8 (H) 0.0 - 4.7 ng/mL Final    Comment:    (NOTE)                             Nonsmokers          <3.9                             Smokers             <5.6 [employer] Electrochemiluminescence Immunoassay (ECLIA) Values obtained with different assay methods or kits cannot be used interchangeably.  Results cannot be interpreted as absolute evidence of the presence or absence of malignant disease. Performed At: [facility] [place], [place] [id number] Rush Farmer MD JK:[id number]     STUDIES:   She underwent CT chest on 02/22/2020 showing: 1. Interval resolution of the peripheral left upper lobe ground-glass opacity identified as new on the previous exam. 2. No substantial change medial right middle lobe pulmonary lesion. 3. No new suspicious pulmonary nodule or mass. 4. Aortic Atherosclerosis (ICD10-I70.0).   Allergies:  Allergies  Allergen Reactions  . Wasp Venom Anaphylaxis  . Keflex [Cephalexin] Rash  . Levofloxacin In D5w Other (See Comments)    Caused issues with muscles in her arms.  Caused issues with muscles in her arms.   Tiajuana Amass [Loracarbef] Hives    Current Medications: Current Outpatient Medications  Medication Sig Dispense Refill  . acetaminophen (TYLENOL 8 HOUR) 650 MG CR tablet Take 500 mg by mouth every 8 (eight) hours as needed for pain. 2-533m twice a day    . ARTIFICIAL TEAR OP Apply to eye.    . Ascorbic Acid (VITAMIN C WITH ROSE  HIPS) 500 MG tablet Take 500 mg by mouth daily.    . citalopram (CELEXA) 10 MG tablet Take 10 mg by mouth daily.    . diclofenac sodium (VOLTAREN) 1 % GEL 3 grams tid prn 3 Tube 3  . fenofibrate (TRICOR) 48 MG tablet Take 48 mg by mouth daily.    . fluticasone (FLONASE) 50 MCG/ACT nasal spray Place into both nostrils as needed.     .Marland Kitchenlevothyroxine (SYNTHROID) 50 MCG tablet Take 50 mcg by mouth daily.    .Marland Kitchenlosartan (COZAAR) 100 MG tablet Take 100 mg by mouth daily.    . meclizine (ANTIVERT) 25 MG tablet Take by mouth as needed.     . minocycline (MINOCIN) 100 MG capsule Take 100 mg by mouth 2 (two) times daily as needed.     . Multiple Vitamins-Minerals (MULTIVITAMIN WITH MINERALS) tablet Take 1 tablet by mouth daily.    .Marland Kitchenosimertinib mesylate (TAGRISSO) 40 MG tablet Take 40 mg by mouth in the morning and at bedtime.    . rivaroxaban (XARELTO) 20 MG TABS  tablet Take 15 mg by mouth daily with supper. 15 mg twice daily    . Vitamin D, Cholecalciferol, 1000 units CAPS Take 2,000 Units by mouth daily.     . vitamin E 400 UNIT capsule Take 400 Units by mouth daily.     No current facility-administered medications for this visit.     ASSESSMENT & PLAN:   Assessment:   1. History of stage IA hormone receptor positive left breast cancer April 2015 treated with lumpectomy.  She was placed on raloxifene was on that for over 5 years.  She has discontinued this oral therapy.  2. Osteoporosis.  Her last bone density from June 2021 revealed worsening and she is now considered osteoporotic.  She continues raloxifene, and we discussed further treatment of her bones today.  She has been placed on Prolia every 6 months, and received her 1st dose in October.  She will be due again in April.  3. Stage IVA non-small cell lung cancer, which is most consistent with adenocarcinoma.  EGFR and PDL1 were positive.  She is on palliative oral chemotherapy with osimertinib 40 mg twice daily.  The CEA had decreased  steadily, but has now increased dramiatically.  Most recent CT imaging in December is fairly stable.      4. Right pleural effusion, resolved.  5. Hyponatremia, stable.   6. Depression, improved with increasing citalopram.    7. Previous Campylobacter infection.  She remains without evidence of recurrence  8. Chronic low back and hip pain, which in part may be secondary to the S1 metastasis, which is stable and controlled with Voltaren gel and Tylenol.    9. Rash felt to be secondary to osimertinib.  She has minimal rash today.  10. Hip fracture in June requiring surgery.  She is getting around well with the assistance of a walker.    11. Thrombocytopenia, improved.  B12 and folate were normal.  12. Bilateral upper and lower lobe pulmonary emboli, without significant right heart strain.  She has since been placed on Xarelto starting at 15 mg twice daily, now taking 20 mg once daily.  I explained that she will likely need to stay on this medication for the rest of her life as she will be at a higher risk for thrombosis.  This has resolved on current CT imaging.  She may decrease her Xarelto to 20 mg every other day as she has had increased bleeding.    Plan: CT imaging from December remains relatively stable.  We are concerned about her rising CEA, and discussed alternative therapies today including oral afatinib and systemic cetuximab monoclonal antibody therapy, platinum chemotherapy or immune therapy as she is PDL-1 positive.  Responses to immunotherapy are not as good in patients with EGFR mutations.  However, as her scan remains stable and she is having a good quality of life, it might be best to continue with her currently therapy.  The patient and her daughter are in agreement as she does not wish to pursue aggressive therapy, so for now, she knows to continue with twice daily osimertinib.  We did discuss comfort care and hospice services, if her disease starts to progress quickly as an  option as well.  Thankfully, she is asymptomatic and physical exam is good.  We will see her back in 1 month with CBC, CMP, and CEA for evaluation.  Patient and her daughter-in-law all verbalized understanding of and agreement to the plan.  She knows to call the office should any  new questions or concerns arise.   I provided 30 minutes of face-to-face time during this this encounter and > 50% was spent counseling as documented under my assessment and plan.    Derwood Kaplan, MD [facility] AT [facility] [address] [place] [address] Dept: [phone] Dept Fax: [phone]   I, Rita Ohara, am acting as scribe for Derwood Kaplan, MD  I have reviewed this report as typed by the medical scribe, and it is complete and accurate.

## 2020-02-24 ENCOUNTER — Other Ambulatory Visit: Payer: Self-pay

## 2020-02-24 ENCOUNTER — Telehealth: Payer: Self-pay | Admitting: Oncology

## 2020-02-24 ENCOUNTER — Other Ambulatory Visit: Payer: Self-pay | Admitting: Oncology

## 2020-02-24 ENCOUNTER — Inpatient Hospital Stay: Payer: Medicare Other | Attending: Oncology | Admitting: Oncology

## 2020-02-24 ENCOUNTER — Encounter: Payer: Self-pay | Admitting: Oncology

## 2020-02-24 VITALS — BP 186/79 | HR 69 | Temp 98.2°F | Resp 18 | Ht 60.0 in | Wt 124.8 lb

## 2020-02-24 DIAGNOSIS — C3491 Malignant neoplasm of unspecified part of right bronchus or lung: Secondary | ICD-10-CM

## 2020-02-24 NOTE — Telephone Encounter (Signed)
Per 12/16 LOS, patient scheduled for January 2022 Appt's.  Gave patient Appt Summary

## 2020-02-25 LAB — CEA: CEA: 107

## 2020-02-29 ENCOUNTER — Encounter: Payer: Self-pay | Admitting: Oncology

## 2020-03-06 ENCOUNTER — Other Ambulatory Visit: Payer: Self-pay | Admitting: *Deleted

## 2020-03-06 NOTE — Patient Outreach (Addendum)
[facility]) Care Management  03/06/2020  Marilyn Richard 23-May-1926 [id number]   Telephone Assessment-Successful-Prevention Measures  RN spoke with pt's daughter-in-law Marilyn Richard) who updated RN on pt's progress and events. States pt is doing well with no reported falls or related injuries. States pt had a history of cancer that has returned and this disease is getting worse with no additional interventions or treatment. Pt with good tolerance and continue to do well with no acute symptoms of delays. Caregiver continues to report pt's adherence to attending all medical appointments and taking all prescribed medications as needed. RN had a discussion on Hospice services as caregiver indicated the [facility] has already provided options and information on this services. RN again alert caregiver on available services for community resources with [facility] social worker or pharmacy needs if needed.   Discussed the current plan of care with updates and will continue to encouraged ongoing adherence with pt's ongoing management of care. Will follow up next month with pt's ongoing management of care and progress with her ongoing medical issues related. Will update her provider at that time with pt's disposition with [facility] services.   Goals Addressed            This Visit's Progress   . [facility]-Follow My Treatment Plan   On track    Follow Up Date 04/07/2020 Timeframe:  Long-Range Goal Priority:  Medium Start Date:         03/06/2020                    Expected End Date:     05/08/2020                    - call for medicine refill 2 or 3 days before it runs out - call the doctor or nurse before I stop taking medicine - call the doctor or nurse to get help with side effects - keep a list of all the medicines I take; vitamins and herbals too - keep follow-up appointments    Why is this important?   Following your treatment plan will help keep your care on track.   Medicine may be the most important piece of your plan.  There are many reasons why you might want to stop taking medicine. You may get tired of taking your medicine. You may think medicine costs too much money. You may find the side effects are too much to bear.  Try some of these steps to make following the treatment plan a little easier.     Notes: Diagnosis of cancer has returned and getting worse with poor prognosis and no interventions available for treatment however pt doing well with no acute symptoms. Will continue to encouraged adherence with the goals and interventions discussed.    . COMPLETED: [facility]-Make and Keep All Appointments   On track    Follow Up Date 03/10/2020   - call to cancel if needed - keep a calendar with prescription refill dates - keep a calendar with appointment dates    Why is this important?   Part of staying healthy is seeing the doctor for follow-up care.  If you forget your appointments, there are some things you can do to stay on track.    Notes: Will extend pending medical appointments       Marilyn Mina, RN Care Management Coordinator [place] Office [phone]

## 2020-03-24 NOTE — Progress Notes (Incomplete)
[name]  [address] [place],  [place]  [address] [phone]  Clinic Day:  03/24/2020  Referring physician: Townsend Roger, MD   This document serves as a record of services personally performed by Marilyn Poisson, MD. It was created on their behalf by [facility], a trained medical scribe. The creation of this record is based on the scribe's personal observations and the provider's statements to them.   CHIEF COMPLAINT:  CC: Stage IVA non-small cell lung cancer and history of stage IA hormone receptor positive left breast cancer  Current Treatment:  osimertinib and denosumab   HISTORY OF PRESENT ILLNESS:  Marilyn Richard is a 87 y.o. female with stage IA (T1b N0 M0) hormone receptor positive left breast cancer diagnosed in April 2015.  She was treated with lumpectomy.  Pathology revealed a 0.8 cm, grade 1, invasive carcinoma with both ductal and lobular features and 2 negative sentinel nodes.  Estrogen receptors were positive, progesterone receptors negative and her 2 Neu negative.  She was placed on raloxifene in May 2015.  She has osteopenia, which was stable on bone density scan in January 2016.  We previously recommended testing for hereditary breast and ovarian cancer and at her last visit she agreed to testing.  She underwent testing with [facility] myRisk Hereditary Cancer Panel test which did not reveal any clinically significant mutation or variants of uncertain significance. She had a right knee replacement in April 2017 and states she still has some problems with pain of the right knee, for which Tylenol is effective.  Bone density scan in August 2018 revealed osteopenia, which was just mildly worse by 3%.  She had a persistent respiratory infection and chest X-ray in May revealed possible pulmonary nodule.  She then had a CT chest, which revealed multiple lung lesions and enlarged lymph nodes.  PET scan in June revealed a  hypermetabolic right middle lobe mass with multiple hypermetabolic right upper lobe pulmonary nodules consistent with bronchogenic carcinoma.  There was also hypermetabolic mediastinal lymph nodes in the subcarinal region with multiple lesions of the right pleural space and a hypermetabolic lymph node adjacent to the GE junction.  There was no other metastatic disease in the abdomen.  There was a single hypermetabolic focus in the S1 vertebral body.  CT guided biopsy of the lung was done in June.  Pathology revealed non-small cell carcinoma consistent with primary lung adenocarcinoma.  She therefore has a stage IVA (T3 N3 M1c) non-small cell lung cancer.  PDL1 was positive.  Foundation 1 testing revealed positive EGFR mutation, [id number].  Due to the EGFR mutation, we recommended palliative oral chemotherapy with osimertinib.  She had a repeat CT chest in July, which revealed significant worsening, so this is a more aggressive lung cancer than we originally thought.  The right middle lobe mass had increased from 3 cm to 4.9 cm in less than 2 months time and she had increased pulmonary and pleural based metastases as well as a new moderate sized right pleural effusion and progressive thoracic adenopathy.  She was declining rapidly.  Also, the CEA had nearly doubled in a short time up to 279 in July.  Due to her advanced age, we started osimertinib 40 mg once daily on July 20th, which is 50% of the standard dose.  Since starting osimertinib, she had improvement in her breathing and right chest pain.  She tolerated osimertinib 40 mg daily well, so it was increased to twice daily  on August 3rd.  She has had intermittent diarrhea and hyponatremia during therapy. She also had mild leukopenia and anemia.  She was seen on August 26th due to persistent diarrhea despite increasing Lomotil to 6 tablets daily. Stool was positive for for Campylobacter.  The patient was placed on azithromycin 500 mg daily for 5 days.  Due to  dehydration, Dr. Nona Dell changed her losartan/hydrochlorothiazide to losartan alone.  We resumed osimertinib 40 mg daily in September.  The CEA had decreased to 77 by that time.    When she was seen on October 28th, 2020, she had worsening rash especially of the face and neck.  She was placed on minocycline 100 mg twice daily, as well as clindamycin gel to use topically 3 times daily as needed for her rash which is associated with osimertinib.  The CEA had decreased to 35. CT chest, abdomen and pelvis in December of 2020 revealed marked improvement of the prior right middle lobe mass now measuring 9 x 12 mm, previously 3.0 x 4.9 cm and just mild residual right pleural effusion.  There was no evidence of metastatic disease.  The CEA was down to 5.2 in February.  We asked her to start calcium 600 mg daily for osteopenia.  Annual bilateral mammogram from April 8th was clear.  CT chest, abdomen and pelvis from April 15th revealed the nodule of the medial segment right middle lobe to be stable at 1.3 x 0.9 cm.  CEA in April was 5.3, and mildly increased to 5.6 in May. She fractured her right hip back in June and underwent surgery.  Bone density scan from June 14th revealed osteoporosis of the right forearm with a T-score of -2.9.  The left femur neck measures -1.7, previously -1.6, and considered osteopenic.  Dual femur total mean measures -0.9, previously -0.5, and considered normal.  CT chest from September 7th revealed moderate burden of bilateral upper and lower lobe pulmonary emboli.  There are no findings for significant right heart strain.  She has stable to slightly smaller right middle lobe pulmonary lesion, now measuring 12 x 8.5 mm, previously 13 x 9 mm.  She was contacted to begin anticoagulation ASAP, but preferred not to go with Lovenox injections.  She was placed on Xarelto 15 mg twice daily.  CEA was 15.5, down from 16.5 in August.  She completed 5 years of raloxifene so this was discontinued in  September.   She was placed on Prolia and had her 1st dose in October.  Her CEA increased to 37 in October and then 68.8 in November.    INTERVAL HISTORY:  [name] is here for routine follow up and states that she has been well.  She continues osimertinib twice daily without difficulty.  She does have a hemorrhage of the right eye which arose last night.  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  CT chest from December 14th revealed interval resolution of the peripheral left upper lobe ground-glass opacity identified as new on the previous exam.  There is no substantial change medial right middle lobe pulmonary lesion measuring 14 mm, previously 12 mm, and no new suspicious pulmonary nodule or mass.  Labs revealed a normal CBC and CMP was unremarkable.  CEA was 107.0, previously 68.8 in November.  Her  appetite is good, and she has gained 2 pounds since her last visit.  She denies fever, chills or other signs of infection.  She denies nausea, vomiting,  bowel issues, or abdominal pain.  She denies sore throat, cough, dyspnea, or chest pain.  Yemaya is here for routine follow up ***.   Her  appetite is good, and she has gained/lost _ pounds since her last visit.  She denies fever, chills or other signs of infection.  She denies nausea, vomiting, bowel issues, or abdominal pain.  She denies sore throat, cough, dyspnea, or chest pain.  REVIEW OF SYSTEMS:  Review of Systems - Oncology   VITALS:  There were no vitals taken for this visit.  Wt Readings from Last 3 Encounters:  02/24/20 124 lb 12.8 oz (56.6 kg)  01/26/20 122 lb 14.4 oz (55.7 kg)  12/17/17 141 lb 12.8 oz (64.3 kg)    There is no height or weight on file to calculate BMI.  Performance status (ECOG): 1 - Symptomatic but completely ambulatory  PHYSICAL EXAM:  Physical Exam  LABS:   CBC Latest Ref Rng & Units 02/22/2020 01/26/2020 10/30/2017  WBC - 4.3 4.5 6.2  Hemoglobin 12.0 - 16.0  12.3 11.8(L) 12.4  Hematocrit 36 - 46 37 37.6 36.7  Platelets 150 - 399 131(A) 149(L) 214   CMP Latest Ref Rng & Units 02/22/2020 01/26/2020 10/30/2017  Glucose 70 - 99 mg/dL - 89 86  BUN 4 - '21 18 22 22  ' Creatinine 0.5 - 1.1 1.0 0.93 1.03(H)  Sodium 137 - 147 134(A) 135 136  Potassium 3.4 - 5.3 4.2 4.1 4.4  Chloride 99 - 108 101 102 100  CO2 13 - 22 24(A) 24 25  Calcium 8.7 - 10.7 9.2 8.7(L) 9.4  Total Protein 6.5 - 8.1 g/dL - 6.5 6.0(L)  Total Bilirubin 0.3 - 1.2 mg/dL - 0.5 0.4  Alkaline Phos 25 - 125 39 34(L) -  AST 13 - 35 '28 22 17  ' ALT 7 - 35 '14 15 13     ' Lab Results  Component Value Date   CEA1 68.8 (H) 01/26/2020   /  CEA  Date Value Ref Range Status  01/26/2020 68.8 (H) 0.0 - 4.7 ng/mL Final    Comment:    (NOTE)                             Nonsmokers          <3.9                             Smokers             <5.6 [employer] Electrochemiluminescence Immunoassay (ECLIA) Values obtained with different assay methods or kits cannot be used interchangeably.  Results cannot be interpreted as absolute evidence of the presence or absence of malignant disease. Performed At: [facility] [place], [place] [id number] Rush Farmer MD YO:[id number]     STUDIES:   No current studies   Allergies:  Allergies  Allergen Reactions  . Wasp Venom Anaphylaxis  . Keflex [Cephalexin] Rash  . Levofloxacin In D5w Other (See Comments)    Caused issues with muscles in her arms.  Caused issues with muscles in her arms.   Tiajuana Amass [Loracarbef] Hives    Current Medications: Current Outpatient Medications  Medication Sig Dispense Refill  . acetaminophen (TYLENOL 8 HOUR) 650 MG CR tablet Take 500 mg by mouth every 8 (eight) hours as needed for pain. 2-570m twice a day    . ARTIFICIAL TEAR OP Apply  to eye.    . Ascorbic Acid (VITAMIN C WITH ROSE HIPS) 500 MG tablet Take 500 mg by mouth daily.    . citalopram (CELEXA) 10 MG tablet Take 10 mg  by mouth daily.    . clindamycin (CLINDAGEL) 1 % gel Apply topically 3 (three) times daily.    . diclofenac sodium (VOLTAREN) 1 % GEL 3 grams tid prn 3 Tube 3  . fenofibrate (TRICOR) 48 MG tablet Take 48 mg by mouth daily.    . fluticasone (FLONASE) 50 MCG/ACT nasal spray Place into both nostrils as needed.     Marland Kitchen levothyroxine (SYNTHROID) 50 MCG tablet Take 50 mcg by mouth daily.    Marland Kitchen losartan (COZAAR) 100 MG tablet Take 100 mg by mouth daily.    . meclizine (ANTIVERT) 25 MG tablet Take by mouth as needed.     . minocycline (MINOCIN) 100 MG capsule Take 100 mg by mouth 2 (two) times daily as needed.     . Multiple Vitamins-Minerals (MULTIVITAMIN WITH MINERALS) tablet Take 1 tablet by mouth daily.    Marland Kitchen osimertinib mesylate (TAGRISSO) 40 MG tablet Take 40 mg by mouth in the morning and at bedtime.    . rivaroxaban (XARELTO) 20 MG TABS tablet Take 15 mg by mouth daily with supper. 15 mg twice daily    . Vitamin D, Cholecalciferol, 1000 units CAPS Take 2,000 Units by mouth daily.     . vitamin E 400 UNIT capsule Take 400 Units by mouth daily.     No current facility-administered medications for this visit.     ASSESSMENT & PLAN:   Assessment:   1. History of stage IA hormone receptor positive left breast cancer April 2015 treated with lumpectomy.  She was placed on raloxifene was on that for over 5 years.  She has discontinued this oral therapy.  2. Osteoporosis.  Her last bone density from June 2021 revealed worsening and she is now considered osteoporotic.  She continues raloxifene, and we discussed further treatment of her bones today.  She has been placed on Prolia every 6 months, and received her 1st dose in October.  She will be due again in April.  3. Stage IVA non-small cell lung cancer, which is most consistent with adenocarcinoma.  EGFR and PDL1 were positive.  She is on palliative oral chemotherapy with osimertinib 40 mg twice daily.  The CEA had decreased steadily, but has now  increased dramiatically.  Most recent CT imaging in December is fairly stable.      4. Right pleural effusion, resolved.  5. Hyponatremia, stable.   6. Depression, improved with increasing citalopram.    7. Previous Campylobacter infection.  She remains without evidence of recurrence  8. Chronic low back and hip pain, which in part may be secondary to the S1 metastasis, which is stable and controlled with Voltaren gel and Tylenol.    9. Rash felt to be secondary to osimertinib.  She has minimal rash today.  10. Hip fracture in June requiring surgery.  She is getting around well with the assistance of a walker.    11. Thrombocytopenia, improved.  B12 and folate were normal.  12. Bilateral upper and lower lobe pulmonary emboli, without significant right heart strain.  She has since been placed on Xarelto starting at 15 mg twice daily, now taking 20 mg once daily.  I explained that she will likely need to stay on this medication for the rest of her life as she will be at a  higher risk for thrombosis.  This has resolved on current CT imaging.  She may decrease her Xarelto to 20 mg every other day as she has had increased bleeding.    Plan: CT imaging from December remains relatively stable.  We are concerned about her rising CEA, and discussed alternative therapies today including oral afatinib and systemic cetuximab monoclonal antibody therapy, platinum chemotherapy or immune therapy as she is PDL-1 positive.  Responses to immunotherapy are not as good in patients with EGFR mutations.  However, as her scan remains stable and she is having a good quality of life, it might be best to continue with her currently therapy.  The patient and her daughter are in agreement as she does not wish to pursue aggressive therapy, so for now, she knows to continue with twice daily osimertinib.  We did discuss comfort care and hospice services, if her disease starts to progress quickly as an option as well.   Thankfully, she is asymptomatic and physical exam is good.  We will see her back in 1 month with CBC, CMP, and CEA for evaluation.  Patient and her daughter-in-law all verbalized understanding of and agreement to the plan.  She knows to call the office should any new questions or concerns arise.   I provided 30 minutes of face-to-face time during this this encounter and > 50% was spent counseling as documented under my assessment and plan.    Derwood Kaplan, MD [facility] AT [facility] [address] [place] [address] Dept: [phone] Dept Fax: [phone]   I, Rita Ohara, am acting as scribe for Derwood Kaplan, MD  I have reviewed this report as typed by the medical scribe, and it is complete and accurate.

## 2020-03-27 ENCOUNTER — Inpatient Hospital Stay: Payer: Medicare Other

## 2020-03-27 ENCOUNTER — Inpatient Hospital Stay: Payer: Medicare Other | Admitting: Oncology

## 2020-03-29 ENCOUNTER — Inpatient Hospital Stay: Payer: Medicare Other | Attending: Oncology

## 2020-03-29 ENCOUNTER — Encounter: Payer: Self-pay | Admitting: Oncology

## 2020-03-29 ENCOUNTER — Inpatient Hospital Stay: Payer: Medicare Other | Attending: Oncology | Admitting: Oncology

## 2020-03-29 ENCOUNTER — Other Ambulatory Visit: Payer: Self-pay

## 2020-03-29 ENCOUNTER — Telehealth: Payer: Self-pay | Admitting: Oncology

## 2020-03-29 ENCOUNTER — Other Ambulatory Visit: Payer: Self-pay | Admitting: Oncology

## 2020-03-29 VITALS — BP 178/70 | HR 78 | Temp 98.4°F | Resp 18 | Wt 124.0 lb

## 2020-03-29 DIAGNOSIS — C3491 Malignant neoplasm of unspecified part of right bronchus or lung: Secondary | ICD-10-CM

## 2020-03-29 DIAGNOSIS — M818 Other osteoporosis without current pathological fracture: Secondary | ICD-10-CM | POA: Diagnosis not present

## 2020-03-29 DIAGNOSIS — E871 Hypo-osmolality and hyponatremia: Secondary | ICD-10-CM | POA: Diagnosis not present

## 2020-03-29 DIAGNOSIS — D649 Anemia, unspecified: Secondary | ICD-10-CM | POA: Diagnosis not present

## 2020-03-29 DIAGNOSIS — C782 Secondary malignant neoplasm of pleura: Secondary | ICD-10-CM | POA: Diagnosis not present

## 2020-03-29 DIAGNOSIS — G893 Neoplasm related pain (acute) (chronic): Secondary | ICD-10-CM | POA: Diagnosis not present

## 2020-03-29 DIAGNOSIS — Z853 Personal history of malignant neoplasm of breast: Secondary | ICD-10-CM

## 2020-03-29 DIAGNOSIS — C3411 Malignant neoplasm of upper lobe, right bronchus or lung: Secondary | ICD-10-CM | POA: Insufficient documentation

## 2020-03-29 DIAGNOSIS — E860 Dehydration: Secondary | ICD-10-CM

## 2020-03-29 DIAGNOSIS — C7951 Secondary malignant neoplasm of bone: Secondary | ICD-10-CM

## 2020-03-29 DIAGNOSIS — C3431 Malignant neoplasm of lower lobe, right bronchus or lung: Secondary | ICD-10-CM

## 2020-03-29 LAB — BASIC METABOLIC PANEL
BUN: 23 — AB (ref 4–21)
CO2: 25 — AB (ref 13–22)
Chloride: 102 (ref 99–108)
Creatinine: 1.1 (ref 0.5–1.1)
Glucose: 102
Potassium: 4.3 (ref 3.4–5.3)
Sodium: 133 — AB (ref 137–147)

## 2020-03-29 LAB — HEPATIC FUNCTION PANEL
ALT: 16 (ref 7–35)
AST: 28 (ref 13–35)
Alkaline Phosphatase: 30 (ref 25–125)
Bilirubin, Total: 0.5

## 2020-03-29 LAB — CBC AND DIFFERENTIAL
HCT: 37 (ref 36–46)
Hemoglobin: 12.3 (ref 12.0–16.0)
Neutrophils Absolute: 3.19
Platelets: 164 (ref 150–399)
WBC: 4.9

## 2020-03-29 LAB — CBC: RBC: 4.09 (ref 3.87–5.11)

## 2020-03-29 LAB — COMPREHENSIVE METABOLIC PANEL
Albumin: 4.3 (ref 3.5–5.0)
Calcium: 9.2 (ref 8.7–10.7)

## 2020-03-29 NOTE — Progress Notes (Signed)
[facility]  [address] [place],  [place]  [address] [phone]  Clinic Day:  03/29/2020  Referring physician: Townsend Roger, MD   This document serves as a record of services personally performed by Hosie Poisson, MD. It was created on their behalf by [facility], a trained medical scribe. The creation of this record is based on the scribe's personal observations and the provider's statements to them.   CHIEF COMPLAINT:  CC: Stage IVA non-small cell lung cancer and history of stage IA hormone receptor positive left breast cancer  Current Treatment:  osimertinib and denosumab   HISTORY OF PRESENT ILLNESS:  Marilyn Richard is a 87 y.o. female with stage IA (T1b N0 M0) hormone receptor positive left breast cancer diagnosed in April 2015.  She was treated with lumpectomy.  Pathology revealed a 0.8 cm, grade 1, invasive carcinoma with both ductal and lobular features and 2 negative sentinel nodes.  Estrogen receptors were positive, progesterone receptors negative and her 2 Neu negative.  She was placed on raloxifene in May 2015.  She has osteopenia, which was stable on bone density scan in January 2016.  We previously recommended testing for hereditary breast and ovarian cancer and at her last visit she agreed to testing.  She underwent testing with [facility] myRisk Hereditary Cancer Panel test which did not reveal any clinically significant mutation or variants of uncertain significance. She had a right knee replacement in April 2017 and states she still has some problems with pain of the right knee, for which Tylenol is effective.  Bone density scan in August 2018 revealed osteopenia, which was just mildly worse by 3%.  She had a persistent respiratory infection and chest X-ray in May revealed possible pulmonary nodule.  She then had a CT chest, which revealed multiple lung lesions and enlarged lymph nodes.  PET scan in June revealed a  hypermetabolic right middle lobe mass with multiple hypermetabolic right upper lobe pulmonary nodules consistent with bronchogenic carcinoma.  There was also hypermetabolic mediastinal lymph nodes in the subcarinal region with multiple lesions of the right pleural space and a hypermetabolic lymph node adjacent to the GE junction.  There was no other metastatic disease in the abdomen.  There was a single hypermetabolic focus in the S1 vertebral body.  CT guided biopsy of the lung was done in June.  Pathology revealed non-small cell carcinoma consistent with primary lung adenocarcinoma.  She therefore has a stage IVA (T3 N3 M1c) non-small cell lung cancer.  PDL1 was positive.  Foundation 1 testing revealed positive EGFR mutation, [id number].  Due to the EGFR mutation, we recommended palliative oral chemotherapy with osimertinib.  She had a repeat CT chest in July, which revealed significant worsening, so this is a more aggressive lung cancer than we originally thought.  The right middle lobe mass had increased from 3 cm to 4.9 cm in less than 2 months time and she had increased pulmonary and pleural based metastases as well as a new moderate sized right pleural effusion and progressive thoracic adenopathy.  She was declining rapidly.  Also, the CEA had nearly doubled in a short time up to 279 in July.  Due to her advanced age, we started osimertinib 40 mg once daily on July 20th, which is 50% of the standard dose.  Since starting osimertinib, she had improvement in her breathing and right chest pain.  She tolerated osimertinib 40 mg daily well, so it was increased to twice daily  on August 3rd.  She has had intermittent diarrhea and hyponatremia during therapy. She also had mild leukopenia and anemia.  She was seen on August 26th due to persistent diarrhea despite increasing Lomotil to 6 tablets daily. Stool was positive for for Campylobacter.  The patient was placed on azithromycin 500 mg daily for 5 days.  Due to  dehydration, Dr. Nona Dell changed her losartan/hydrochlorothiazide to losartan alone.  We resumed osimertinib 40 mg daily in September.  The CEA had decreased to 77 by that time.    When she was seen on October 28th, 2020, she had worsening rash especially of the face and neck.  She was placed on minocycline 100 mg twice daily, as well as clindamycin gel to use topically 3 times daily as needed for her rash which is associated with osimertinib.  The CEA had decreased to 35. CT chest, abdomen and pelvis in December of 2020 revealed marked improvement of the prior right middle lobe mass now measuring 9 x 12 mm, previously 3.0 x 4.9 cm and just mild residual right pleural effusion.  There was no evidence of metastatic disease.  The CEA was down to 5.2 in February.  We asked her to start calcium 600 mg daily for osteopenia.  Annual bilateral mammogram from April 8th was clear.  CT chest, abdomen and pelvis from April 15th revealed the nodule of the medial segment right middle lobe to be stable at 1.3 x 0.9 cm.  CEA in April was 5.3, and mildly increased to 5.6 in May. She fractured her right hip back in June and underwent surgery.  Bone density scan from June 14th revealed osteoporosis of the right forearm with a T-score of -2.9.  The left femur neck measures -1.7, previously -1.6, and considered osteopenic.  Dual femur total mean measures -0.9, previously -0.5, and considered normal.  CT chest from September 7th revealed moderate burden of bilateral upper and lower lobe pulmonary emboli.  There are no findings for significant right heart strain.  She has stable to slightly smaller right middle lobe pulmonary lesion, now measuring 12 x 8.5 mm, previously 13 x 9 mm.  She was contacted to begin anticoagulation ASAP, but preferred not to go with Lovenox injections.  She was placed on Xarelto 15 mg twice daily.  CEA was 15.5, down from 16.5 in August.  She completed 5 years of raloxifene so this was discontinued in  September.   She was placed on Prolia and had her 1st dose in October.  Her CEA increased to 37 in October and then 68.8 in November and 107 in December.  CT chest from December 14th revealed interval resolution of the peripheral left upper lobe ground-glass opacity identified as new on the previous exam.  There is no substantial change medial right middle lobe pulmonary lesion measuring 14 mm, previously 12 mm, and no new suspicious pulmonary nodule or mass.   INTERVAL HISTORY:  Marilyn Richard is here for routine follow up and states that she has been well.  She continues osimertinib but has taken once daily for the past week, as she did not receive her refill until yesterday.  She does report intermittent episodes of vertigo, which her primary physician feels is related to an inner ear issue and she has had that before.  She continues her blood thinner every other day.  She is hypertensive today, but feels this is due to walking from the car to the clinic room.  Blood counts and chemistries are unremarkable except  for a sodium of 133, a BUN of 23, and a creatinine of 1.1, previously 1.0.  Her  appetite is good, and her weight is stable since her last visit.  She denies fever, chills or other signs of infection.  She denies nausea, vomiting, bowel issues, or abdominal pain.  She denies sore throat, cough, dyspnea, or chest pain.  REVIEW OF SYSTEMS:  Review of Systems  Constitutional: Negative.   HENT:  Negative.   Eyes: Negative.   Respiratory: Negative.   Cardiovascular: Negative.  Negative for chest pain.  Gastrointestinal: Negative.   Endocrine: Negative.   Genitourinary: Positive for nocturia.   Musculoskeletal: Negative.   Skin: Negative.        Dry skin  Neurological: Positive for dizziness (intermittent, due to vertigo).  Hematological: Negative.   Psychiatric/Behavioral: Negative.      VITALS:  Blood pressure (!) 178/70, pulse 78, temperature 98.4 F (36.9 C), temperature source Oral,  resp. rate 18, weight 124 lb (56.2 kg), SpO2 95 %.  Wt Readings from Last 3 Encounters:  03/29/20 124 lb (56.2 kg)  02/24/20 124 lb 12.8 oz (56.6 kg)  01/26/20 122 lb 14.4 oz (55.7 kg)    Body mass index is 24.22 kg/m.  Performance status (ECOG): 1 - Symptomatic but completely ambulatory  PHYSICAL EXAM:  Physical Exam Constitutional:      General: She is not in acute distress.    Appearance: Normal appearance. She is normal weight.  HENT:     Head: Normocephalic and atraumatic.  Eyes:     General: No scleral icterus.    Extraocular Movements: Extraocular movements intact.     Conjunctiva/sclera: Conjunctivae normal.     Pupils: Pupils are equal, round, and reactive to light.  Cardiovascular:     Rate and Rhythm: Normal rate and regular rhythm.     Pulses: Normal pulses.     Heart sounds: Normal heart sounds. No murmur heard. No friction rub. No gallop.   Pulmonary:     Effort: Pulmonary effort is normal. No respiratory distress.     Breath sounds: Normal breath sounds.     Comments: A few crackles at the left base. Abdominal:     General: Bowel sounds are normal. There is no distension.     Palpations: Abdomen is soft. There is no hepatomegaly, splenomegaly or mass.     Tenderness: There is no abdominal tenderness.  Musculoskeletal:        General: Normal range of motion.     Cervical back: Normal range of motion and neck supple.     Right lower leg: No edema.     Left lower leg: No edema.     Comments: She has rheumatoid changes of the bilateral hands.  Lymphadenopathy:     Cervical: No cervical adenopathy.  Skin:    General: Skin is warm and dry.     Comments: Some facial rash around the nose, perioral area and on the forehead.  Neurological:     General: No focal deficit present.     Mental Status: She is alert and oriented to person, place, and time. Mental status is at baseline.  Psychiatric:        Mood and Affect: Mood normal.        Behavior: Behavior  normal.        Thought Content: Thought content normal.        Judgment: Judgment normal.     LABS:   CBC Latest Ref Rng & Units 02/22/2020  01/26/2020 10/30/2017  WBC - 4.3 4.5 6.2  Hemoglobin 12.0 - 16.0 12.3 11.8(L) 12.4  Hematocrit 36 - 46 37 37.6 36.7  Platelets 150 - 399 131(A) 149(L) 214   CMP Latest Ref Rng & Units 02/22/2020 01/26/2020 10/30/2017  Glucose 70 - 99 mg/dL - 89 86  BUN 4 - '21 18 22 22  ' Creatinine 0.5 - 1.1 1.0 0.93 1.03(H)  Sodium 137 - 147 134(A) 135 136  Potassium 3.4 - 5.3 4.2 4.1 4.4  Chloride 99 - 108 101 102 100  CO2 13 - 22 24(A) 24 25  Calcium 8.7 - 10.7 9.2 8.7(L) 9.4  Total Protein 6.5 - 8.1 g/dL - 6.5 6.0(L)  Total Bilirubin 0.3 - 1.2 mg/dL - 0.5 0.4  Alkaline Phos 25 - 125 39 34(L) -  AST 13 - 35 '28 22 17  ' ALT 7 - 35 '14 15 13     ' Lab Results  Component Value Date   CEA1 68.8 (H) 01/26/2020   /  CEA  Date Value Ref Range Status  01/26/2020 68.8 (H) 0.0 - 4.7 ng/mL Final    Comment:    (NOTE)                             Nonsmokers          <3.9                             Smokers             <5.6 [employer] Electrochemiluminescence Immunoassay (ECLIA) Values obtained with different assay methods or kits cannot be used interchangeably.  Results cannot be interpreted as absolute evidence of the presence or absence of malignant disease. Performed At: [facility] [place], [place] [id number] Rush Farmer MD HK:[id number]     STUDIES:   No current studies  Allergies:  Allergies  Allergen Reactions  . Wasp Venom Anaphylaxis  . Keflex [Cephalexin] Rash  . Levofloxacin In D5w Other (See Comments)    Caused issues with muscles in her arms.  Caused issues with muscles in her arms.   Tiajuana Amass [Loracarbef] Hives    Current Medications: Current Outpatient Medications  Medication Sig Dispense Refill  . acetaminophen (TYLENOL 8 HOUR) 650 MG CR tablet Take 500 mg by mouth every 8 (eight) hours as  needed for pain. 2-536m twice a day    . ARTIFICIAL TEAR OP Apply to eye.    . Ascorbic Acid (VITAMIN C WITH ROSE HIPS) 500 MG tablet Take 500 mg by mouth daily.    . citalopram (CELEXA) 10 MG tablet Take 10 mg by mouth daily.    . clindamycin (CLINDAGEL) 1 % gel Apply topically 3 (three) times daily.    . diclofenac sodium (VOLTAREN) 1 % GEL 3 grams tid prn 3 Tube 3  . fenofibrate 160 MG tablet     . fluticasone (FLONASE) 50 MCG/ACT nasal spray Place into both nostrils as needed.     .Marland Kitchenlevothyroxine (SYNTHROID) 50 MCG tablet Take 50 mcg by mouth daily.    .Marland Kitchenlosartan (COZAAR) 100 MG tablet Take 100 mg by mouth daily.    . meclizine (ANTIVERT) 25 MG tablet Take by mouth as needed.     . minocycline (MINOCIN) 100 MG capsule Take 100 mg by mouth 2 (two) times daily as needed.     . Multiple Vitamins-Minerals (MULTIVITAMIN  WITH MINERALS) tablet Take 1 tablet by mouth daily.    Marland Kitchen osimertinib mesylate (TAGRISSO) 40 MG tablet Take 40 mg by mouth in the morning and at bedtime.    . rivaroxaban (XARELTO) 20 MG TABS tablet Take 15 mg by mouth every other day.    . Vitamin D, Cholecalciferol, 1000 units CAPS Take 2,000 Units by mouth daily.     . vitamin E 400 UNIT capsule Take 400 Units by mouth daily.     No current facility-administered medications for this visit.     ASSESSMENT & PLAN:   Assessment:   1. History of stage IA hormone receptor positive left breast cancer April 2015 treated with lumpectomy.  She was placed on raloxifene was on that for over 5 years.  She has discontinued this oral therapy.  2. Osteoporosis.  Her last bone density from June 2021 revealed worsening and she is now considered osteoporotic.  She continues raloxifene, and we discussed further treatment of her bones today.  She has been placed on Prolia every 6 months, and received her 1st dose in October.  She will be due again in April.  3. Stage IVA non-small cell lung cancer, which is most consistent with  adenocarcinoma.  EGFR and PDL1 were positive.  She is on palliative oral chemotherapy with osimertinib 40 mg twice daily.  The CEA had decreased steadily, but has now increased dramiatically.  Most recent CT imaging in December is fairly stable, so we have decided to continue her present therapy.     4. Hyponatremia, stable.   5. Dehydration with BUN of 23.    6. Previous Campylobacter infection.  She remains without evidence of recurrence  7. Chronic low back and hip pain, which in part may be secondary to the S1 metastasis, which is stable and controlled with Voltaren gel and Tylenol.    8. Rash felt to be secondary to osimertinib.  She has minimal rash today.  9. Hip fracture in June 2021 requiring surgery.  She is getting around well with the assistance of a walker.    10. Bilateral upper and lower lobe pulmonary emboli, without significant right heart strain.  She has since been placed on Xarelto starting at 15 mg twice daily, now taking 20 mg once daily.  I explained that she will likely need to stay on this medication for the rest of her life as she will be at a higher risk for thrombosis.  This has resolved on current CT imaging.  She may decrease her Xarelto to 20 mg every other day as she has had increased bleeding.  Plan: The patient knows to continue with twice daily osimertinib now that she has her refill.  She knows to push fluids.  We will see her back in 1 month with CBC, CMP, and CEA for evaluation.  Patient and her daughter-in-law all verbalized understanding of and agreement to the plan.  She knows to call the office should any new questions or concerns arise.   I provided 20 minutes of face-to-face time during this this encounter and > 50% was spent counseling as documented under my assessment and plan.    Derwood Kaplan, MD [facility] AT [facility] [address] [place] [address] Dept: [phone] Dept Fax:  [phone]   I, Rita Ohara, am acting as scribe for Derwood Kaplan, MD  I have reviewed this report as typed by the medical scribe, and it is complete and  accurate.

## 2020-03-29 NOTE — Telephone Encounter (Signed)
Per 1/19 los next appt sched and given to patient

## 2020-03-30 ENCOUNTER — Other Ambulatory Visit: Payer: Self-pay | Admitting: Hematology and Oncology

## 2020-03-30 LAB — CEA: CEA: 185 ng/mL — ABNORMAL HIGH (ref 0.0–4.7)

## 2020-03-31 ENCOUNTER — Telehealth: Payer: Self-pay

## 2020-03-31 NOTE — Telephone Encounter (Signed)
Patient called and notified of CEA results.

## 2020-03-31 NOTE — Telephone Encounter (Signed)
-----   Message from Derwood Kaplan, MD sent at 03/31/2020  2:04 PM EST ----- Regarding: call pt Tell her CEA up to 185, she will worry but will plan repeat CT at 3 months, in March

## 2020-04-07 ENCOUNTER — Other Ambulatory Visit: Payer: Self-pay | Admitting: *Deleted

## 2020-04-07 NOTE — Patient Outreach (Signed)
[facility]) Care Management  04/07/2020  Marilyn Richard Mataya Kilduff 01/03/1927 [id number]   Telephone Assessment-Successful-Preventive Measures (Cancer)  RN spoke with pt today and received an update that she remains adherent to all therapies and treatment related to her ongoing prognosis. PPlan of care discussed and updated accordingly as pt continues to indicate "I am doing the best that I can do and feel I am at my max". Pt has requested quarterly follow up call indicating she no longer needs monthly. Plan of care with updated on track notes.  RN will update pt's provider on her disposition with [facility] services and RN will follow up in 3 months for the quarterly outreach. No further needs at this time as pt continue to be on track with the existing goals.  Goals Addressed            This Visit's Progress   . THN-Follow My Treatment Plan   On track    Follow Up Date 07/07/2020 Timeframe:  Long-Range Goal Priority:  Medium Start Date:         03/06/2020                    Expected End Date:     07/07/2020                   - call for medicine refill 2 or 3 days before it runs out - call the doctor or nurse before I stop taking medicine - call the doctor or nurse to get help with side effects - keep a list of all the medicines I take; vitamins and herbals too - keep follow-up appointments    Why is this important?   Following your treatment plan will help keep your care on track.  Medicine may be the most important piece of your plan.  There are many reasons why you might want to stop taking medicine. You may get tired of taking your medicine. You may think medicine costs too much money. You may find the side effects are too much to bear.  Try some of these steps to make following the treatment plan a little easier.     Notes:   1/28-Diagnosis of cancer has returned and getting worse with poor prognosis and no interventions available for treatment however  pt doing well with no acute symptoms. Will continue to encouraged adherence with the goals and interventions discussed.        Raina Mina, RN Care Management Coordinator [facility] Office [phone]

## 2020-04-25 ENCOUNTER — Telehealth: Payer: Self-pay

## 2020-04-25 DIAGNOSIS — Z79899 Other long term (current) drug therapy: Secondary | ICD-10-CM | POA: Diagnosis not present

## 2020-04-25 DIAGNOSIS — E039 Hypothyroidism, unspecified: Secondary | ICD-10-CM | POA: Diagnosis not present

## 2020-04-25 DIAGNOSIS — Z86711 Personal history of pulmonary embolism: Secondary | ICD-10-CM | POA: Diagnosis not present

## 2020-04-25 DIAGNOSIS — R1013 Epigastric pain: Secondary | ICD-10-CM | POA: Diagnosis not present

## 2020-04-25 DIAGNOSIS — Z85118 Personal history of other malignant neoplasm of bronchus and lung: Secondary | ICD-10-CM | POA: Diagnosis not present

## 2020-04-25 DIAGNOSIS — Z853 Personal history of malignant neoplasm of breast: Secondary | ICD-10-CM | POA: Diagnosis not present

## 2020-04-25 DIAGNOSIS — K219 Gastro-esophageal reflux disease without esophagitis: Secondary | ICD-10-CM | POA: Diagnosis not present

## 2020-04-25 DIAGNOSIS — Z7901 Long term (current) use of anticoagulants: Secondary | ICD-10-CM | POA: Diagnosis not present

## 2020-04-25 DIAGNOSIS — I10 Essential (primary) hypertension: Secondary | ICD-10-CM | POA: Diagnosis not present

## 2020-04-25 DIAGNOSIS — R0789 Other chest pain: Secondary | ICD-10-CM | POA: Diagnosis not present

## 2020-04-25 DIAGNOSIS — R079 Chest pain, unspecified: Secondary | ICD-10-CM | POA: Diagnosis not present

## 2020-04-25 DIAGNOSIS — I491 Atrial premature depolarization: Secondary | ICD-10-CM | POA: Diagnosis not present

## 2020-04-25 LAB — BASIC METABOLIC PANEL
BUN: 18 (ref 4–21)
CO2: 24 — AB (ref 13–22)
Chloride: 97 — AB (ref 99–108)
Creatinine: 0.7 (ref 0.5–1.1)
Glucose: 122
Potassium: 4.3 (ref 3.4–5.3)
Sodium: 129 — AB (ref 137–147)

## 2020-04-25 LAB — COMPREHENSIVE METABOLIC PANEL
Albumin: 4 (ref 3.5–5.0)
Calcium: 8.8 (ref 8.7–10.7)

## 2020-04-25 LAB — CBC: RBC: 4.01 (ref 3.87–5.11)

## 2020-04-25 LAB — HEPATIC FUNCTION PANEL
ALT: 16 (ref 7–35)
AST: 25 (ref 13–35)
Alkaline Phosphatase: 36 (ref 25–125)
Bilirubin, Total: 0.5

## 2020-04-25 LAB — CBC AND DIFFERENTIAL
HCT: 35 — AB (ref 36–46)
Hemoglobin: 11.8 — AB (ref 12.0–16.0)
Neutrophils Absolute: 6.26
Platelets: 140 — AB (ref 150–399)
WBC: 7.2

## 2020-04-25 NOTE — Telephone Encounter (Addendum)
04/26/20 1315- I reviewed the ED notes on pt (from yesterday's visit). ED physician believes that after workup the pain is not cardiac in nature but more likely a esophageal spasms/hiatal hernia/ reflux event. Pt was offered admission for cardiac workup. She declined as she said she wasn't a candidate for any type of surgery anyway and she wasn't having chest pain. She felt the pain was most likely GI in nature per ED notes.  Just FYi      Pt's daughter in law, Marilyn Richard, called to make [place] aware that Marilyn Richard called her & told her she was having chest pain. Marilyn Richard was on her way home from appt @ [facility] with her grandson. Marilyn Richard told her she got choked on something this morning, and hasn't felt right since. Marilyn Richard asked her if she was having any shortness of breath, and she was. Anyway, Marilyn Richard asked her why she hadn't called 911? Marilyn Richard is on her way to Marilyn Richard's home right now, & she plans to call 911 when she gets there. Pt had not called her son, Marilyn Richard, either. Marilyn Richard did state she has been having more SOB recently anyway.  Just FYI. She does have appt here 04/27/20.

## 2020-04-26 NOTE — Progress Notes (Signed)
[facility]  [address] [place],  [place]  [address] [phone]  Clinic Day:  04/27/2020  Referring physician: Townsend Roger, MD   This document serves as a record of services personally performed by Hosie Poisson, MD. It was created on their behalf by [facility], a trained medical scribe. The creation of this record is based on the scribe's personal observations and the provider's statements to them.   CHIEF COMPLAINT:  CC: Stage IVA non-small cell lung cancer and history of stage IA hormone receptor positive left breast cancer  Current Treatment:  osimertinib and denosumab   HISTORY OF PRESENT ILLNESS:  Marilyn Richard is a 87 y.o. female with stage IA (T1b N0 M0) hormone receptor positive left breast cancer diagnosed in April 2015.  She was treated with lumpectomy.  Pathology revealed a 0.8 cm, grade 1, invasive carcinoma with both ductal and lobular features and 2 negative sentinel nodes.  Estrogen receptors were positive, progesterone receptors negative and her 2 Neu negative.  She was placed on raloxifene in May 2015.  She has osteopenia, which was stable on bone density scan in January 2016.  We previously recommended testing for hereditary breast and ovarian cancer and at her last visit she agreed to testing.  She underwent testing with [facility] myRisk Hereditary Cancer Panel test which did not reveal any clinically significant mutation or variants of uncertain significance. She had a right knee replacement in April 2017 and states she still has some problems with pain of the right knee, for which Tylenol is effective.  Bone density scan in August 2018 revealed osteopenia, which was just mildly worse by 3%.  She had a persistent respiratory infection and chest X-ray in May revealed possible pulmonary nodule.  She then had a CT chest, which revealed multiple lung lesions and enlarged lymph nodes.  PET scan in June revealed a  hypermetabolic right middle lobe mass with multiple hypermetabolic right upper lobe pulmonary nodules consistent with bronchogenic carcinoma.  There was also hypermetabolic mediastinal lymph nodes in the subcarinal region with multiple lesions of the right pleural space and a hypermetabolic lymph node adjacent to the GE junction.  There was no other metastatic disease in the abdomen.  There was a single hypermetabolic focus in the S1 vertebral body.  CT guided biopsy of the lung was done in June.  Pathology revealed non-small cell carcinoma consistent with primary lung adenocarcinoma.  She therefore has a stage IVA (T3 N3 M1c) non-small cell lung cancer.  PDL1 was positive.  Foundation 1 testing revealed positive EGFR mutation, [id number].  Due to the EGFR mutation, we recommended palliative oral chemotherapy with osimertinib.  She had a repeat CT chest in July, which revealed significant worsening, so this is a more aggressive lung cancer than we originally thought.  The right middle lobe mass had increased from 3 cm to 4.9 cm in less than 2 months time and she had increased pulmonary and pleural based metastases as well as a new moderate sized right pleural effusion and progressive thoracic adenopathy.  She was declining rapidly.  Also, the CEA had nearly doubled in a short time up to 279 in July.  Due to her advanced age, we started osimertinib 40 mg once daily on July 20th, which is 50% of the standard dose.  Since starting osimertinib, she had improvement in her breathing and right chest pain.  She tolerated osimertinib 40 mg daily well, so it was increased to twice daily  on August 3rd.  She has had intermittent diarrhea and hyponatremia during therapy. She also had mild leukopenia and anemia.  She was seen on August 26th due to persistent diarrhea despite increasing Lomotil to 6 tablets daily. Stool was positive for for Campylobacter.  The patient was placed on azithromycin 500 mg daily for 5 days.  Due to  dehydration, Dr. Nona Dell changed her losartan/hydrochlorothiazide to losartan alone.  We resumed osimertinib 40 mg daily in September.  The CEA had decreased to 77 by that time.    When she was seen on October 28th, 2020, she had worsening rash especially of the face and neck.  She was placed on minocycline 100 mg twice daily, as well as clindamycin gel to use topically 3 times daily as needed for her rash which is associated with osimertinib.  The CEA had decreased to 35. CT chest, abdomen and pelvis in December of 2020 revealed marked improvement of the prior right middle lobe mass now measuring 9 x 12 mm, previously 3.0 x 4.9 cm and just mild residual right pleural effusion.  There was no evidence of metastatic disease.  The CEA was down to 5.2 in February.  We asked her to start calcium 600 mg daily for osteopenia.  Annual bilateral mammogram from April 8th was clear.  CT chest, abdomen and pelvis from April 15th revealed the nodule of the medial segment right middle lobe to be stable at 1.3 x 0.9 cm.  CEA in April was 5.3, and mildly increased to 5.6 in May. She fractured her right hip back in June and underwent surgery.  Bone density scan from June 14th revealed osteoporosis of the right forearm with a T-score of -2.9.  The left femur neck measures -1.7, previously -1.6, and considered osteopenic.  Dual femur total mean measures -0.9, previously -0.5, and considered normal.  CT chest from September 7th revealed moderate burden of bilateral upper and lower lobe pulmonary emboli.  There are no findings for significant right heart strain.  She has stable to slightly smaller right middle lobe pulmonary lesion, now measuring 12 x 8.5 mm, previously 13 x 9 mm.  She was contacted to begin anticoagulation ASAP, but preferred not to go with Lovenox injections.  She was placed on Xarelto 15 mg twice daily.  CEA was 15.5, down from 16.5 in August.  She completed 5 years of raloxifene so this was discontinued in  September.   She was placed on Prolia and had her 1st dose in October.  Her CEA increased to 37 in October and then 68.8 in November and 107 in December.  CT chest from December 14th revealed interval resolution of the peripheral left upper lobe ground-glass opacity identified as new on the previous exam.  There is no substantial change medial right middle lobe pulmonary lesion measuring 14 mm, previously 12 mm, and no new suspicious pulmonary nodule or mass.   INTERVAL HISTORY:  Marilyn Richard is here for routine follow up after presenting to the emergency department on February 15th due to chest pain.  She described the feeling as if food was stuck so that she would be unable to pass anything.  Her pain worsened with liquids.  By the time she was seen for evaluation, the sensation had subsided, but she reports that this continued to occur intermittently throughout the rest of the day.  Chest x-ray was negative.  Blood counts and chemistries were unremarkable except for a hemoglobin of 11.8 and a sodium of 129.  Her  symptoms were felt to represent esophageal spasms.  Her chest remains sore from this episode.  She did have a choking episode on Tuesday and also picked up her 28 pound grandson the other day.  She was hypertensive in the emergency room and remains this way today.  She notes that her pressure at home has been normal, but she will keep a log to discuss with Dr. Nona Dell.  She continues osimertinib daily without difficulty.  Her  appetite is good, and she has gained nearly 1 pound since her last visit.  She denies fever, chills or other signs of infection.  She denies nausea, vomiting, bowel issues, or abdominal pain.  She denies sore throat, cough, dyspnea, or chest pain today.  She is accompanied by her daughter-in-law.  REVIEW OF SYSTEMS:  Review of Systems  Constitutional: Negative.  Negative for appetite change, chills, fatigue, fever and unexpected weight change.  HENT:  Negative.   Eyes:  Negative.   Respiratory: Negative.  Negative for chest tightness, cough, hemoptysis, shortness of breath and wheezing.   Cardiovascular: Positive for chest pain (intermittent, due to esophageal spasms). Negative for leg swelling and palpitations.  Gastrointestinal: Negative.  Negative for abdominal distention, abdominal pain, blood in stool, constipation, diarrhea, nausea and vomiting.  Endocrine: Negative.   Genitourinary: Negative.  Negative for dysuria, frequency and hematuria.   Musculoskeletal: Positive for arthralgias, back pain (chronic) and gait problem (uses a walker to ambulate). Negative for flank pain, myalgias, neck pain and neck stiffness.  Skin: Negative.   Neurological: Positive for gait problem (uses a walker to ambulate). Negative for dizziness, extremity weakness, headaches, light-headedness, numbness, seizures and speech difficulty.  Hematological: Negative.   Psychiatric/Behavioral: Negative.  Negative for depression and sleep disturbance. The patient is not nervous/anxious.      VITALS:  Blood pressure (!) 197/77, pulse 78, temperature 98 F (36.7 C), temperature source Oral, resp. rate 18, height 5' (1.524 m), weight 124 lb 14.4 oz (56.7 kg), SpO2 96 %.  Wt Readings from Last 3 Encounters:  04/27/20 124 lb 14.4 oz (56.7 kg)  03/29/20 124 lb (56.2 kg)  02/24/20 124 lb 12.8 oz (56.6 kg)    Body mass index is 24.39 kg/m.  Performance status (ECOG): 1 - Symptomatic but completely ambulatory  PHYSICAL EXAM:  Physical Exam Constitutional:      General: She is not in acute distress.    Appearance: Normal appearance. She is normal weight.  HENT:     Head: Normocephalic and atraumatic.  Eyes:     General: No scleral icterus.    Extraocular Movements: Extraocular movements intact.     Conjunctiva/sclera: Conjunctivae normal.     Pupils: Pupils are equal, round, and reactive to light.  Cardiovascular:     Rate and Rhythm: Normal rate and regular rhythm.     Pulses:  Normal pulses.     Heart sounds: Normal heart sounds. No murmur heard. No friction rub. No gallop.   Pulmonary:     Effort: Pulmonary effort is normal. No respiratory distress.     Breath sounds: Normal breath sounds.  Abdominal:     General: Bowel sounds are normal. There is no distension.     Palpations: Abdomen is soft. There is no hepatomegaly, splenomegaly or mass.     Tenderness: There is no abdominal tenderness.  Musculoskeletal:        General: Normal range of motion.     Cervical back: Normal range of motion and neck supple.  Right lower leg: No edema.     Left lower leg: No edema.  Lymphadenopathy:     Cervical: No cervical adenopathy.  Skin:    General: Skin is warm and dry.  Neurological:     General: No focal deficit present.     Mental Status: She is alert and oriented to person, place, and time. Mental status is at baseline.  Psychiatric:        Mood and Affect: Mood normal.        Behavior: Behavior normal.        Thought Content: Thought content normal.        Judgment: Judgment normal.     LABS:   CBC Latest Ref Rng & Units 03/29/2020 02/22/2020 01/26/2020  WBC - 4.9 4.3 4.5  Hemoglobin 12.0 - 16.0 12.3 12.3 11.8(L)  Hematocrit 36 - 46 37 37 37.6  Platelets 150 - 399 164 131(A) 149(L)   CMP Latest Ref Rng & Units 03/29/2020 02/22/2020 01/26/2020  Glucose 70 - 99 mg/dL - - 89  BUN 4 - 21 23(A) 18 22  Creatinine 0.5 - 1.1 1.1 1.0 0.93  Sodium 137 - 147 133(A) 134(A) 135  Potassium 3.4 - 5.3 4.3 4.2 4.1  Chloride 99 - 108 102 101 102  CO2 13 - 22 25(A) 24(A) 24  Calcium 8.7 - 10.7 9.2 9.2 8.7(L)  Total Protein 6.5 - 8.1 g/dL - - 6.5  Total Bilirubin 0.3 - 1.2 mg/dL - - 0.5  Alkaline Phos 25 - 125 30 39 34(L)  AST 13 - 35 '28 28 22  ' ALT 7 - 35 '16 14 15     ' Lab Results  Component Value Date   CEA1 185.0 (H) 03/29/2020   /  CEA  Date Value Ref Range Status  03/29/2020 185.0 (H) 0.0 - 4.7 ng/mL Final    Comment:    (NOTE)                              Nonsmokers          <3.9                             Smokers             <5.6 [employer] Electrochemiluminescence Immunoassay (ECLIA) Values obtained with different assay methods or kits cannot be used interchangeably.  Results cannot be interpreted as absolute evidence of the presence or absence of malignant disease. Performed At: [facility] [place], [place] [id number] Rush Farmer MD UT:[id number]     STUDIES:   No current studies  Allergies:  Allergies  Allergen Reactions  . Wasp Venom Anaphylaxis  . Keflex [Cephalexin] Rash  . Levofloxacin In D5w Other (See Comments)    Caused issues with muscles in her arms.  Caused issues with muscles in her arms.   Tiajuana Amass [Loracarbef] Hives    Current Medications: Current Outpatient Medications  Medication Sig Dispense Refill  . acetaminophen (TYLENOL 8 HOUR) 650 MG CR tablet Take 500 mg by mouth every 8 (eight) hours as needed for pain. 2-534m twice a day    . ARTIFICIAL TEAR OP Apply to eye.    . Ascorbic Acid (VITAMIN C WITH ROSE HIPS) 500 MG tablet Take 500 mg by mouth daily.    . citalopram (CELEXA) 10 MG tablet Take 10 mg by mouth daily.    .Marland Kitchen  clindamycin (CLINDAGEL) 1 % gel Apply topically 3 (three) times daily.    . diclofenac sodium (VOLTAREN) 1 % GEL 3 grams tid prn 3 Tube 3  . fenofibrate 160 MG tablet     . fluticasone (FLONASE) 50 MCG/ACT nasal spray Place into both nostrils as needed.     Marland Kitchen levothyroxine (SYNTHROID) 50 MCG tablet Take 50 mcg by mouth daily.    Marland Kitchen losartan (COZAAR) 100 MG tablet Take 100 mg by mouth daily.    . meclizine (ANTIVERT) 25 MG tablet Take by mouth as needed.     . minocycline (MINOCIN) 100 MG capsule Take 100 mg by mouth daily as needed.    . Multiple Vitamins-Minerals (MULTIVITAMIN WITH MINERALS) tablet Take 1 tablet by mouth daily.    Marland Kitchen osimertinib mesylate (TAGRISSO) 40 MG tablet Take 40 mg by mouth in the morning and at bedtime.    .  rivaroxaban (XARELTO) 20 MG TABS tablet Take 20 mg by mouth every other day.    . Vitamin D, Cholecalciferol, 1000 units CAPS Take 2,000 Units by mouth daily.     . vitamin E 400 UNIT capsule Take 400 Units by mouth daily.     No current facility-administered medications for this visit.     ASSESSMENT & PLAN:   Assessment:   1. History of stage IA hormone receptor positive left breast cancer April 2015 treated with lumpectomy.  She was placed on raloxifene was on that for over 5 years.  She has discontinued this oral therapy.  2. Osteoporosis.  Her last bone density from June 2021 revealed worsening and she is now considered osteoporotic.  She continues raloxifene, and we discussed further treatment of her bones today.  She has been placed on Prolia every 6 months, and received her 1st dose in October.  She will be due again in April.  3. Stage IVA non-small cell lung cancer, which is most consistent with adenocarcinoma.  EGFR and PDL1 were positive.  She is on palliative oral chemotherapy with osimertinib 40 mg twice daily.  The CEA had decreased steadily, but has now increased dramiatically.  Most recent CT imaging in December is fairly stable, so we have decided to continue her present therapy.     4. Hyponatremia, mildly worse.    5. Dehydration, improved.    6. Previous Campylobacter infection.  She remains without evidence of recurrence  7. Chronic low back and hip pain, which in part may be secondary to the S1 metastasis, which is stable and controlled with Voltaren gel and Tylenol.    8. Rash felt to be secondary to osimertinib.  She has minimal rash today while on minocycline and so she can stay on 1 pill daily.  9. Hip fracture in June 2021 requiring surgery.  She is getting around well with the assistance of a walker.    10. Bilateral upper and lower lobe pulmonary emboli, without significant right heart strain.  She has since been placed on Xarelto starting at 15 mg twice  daily, then 20 mg once daily.  I explained that she will likely need to stay on this medication for the rest of her life as she will be at a higher risk for thrombosis.  This has resolved on current CT imaging.  She may decrease her Xarelto to 20 mg every other day as she has had increased bleeding.  11.  Intermittent esophageal spasms, resulting in a trip to the emergency room on February 15th.    Plan: The  patient knows to continue with twice daily osimertinib.  She continues Xarelto 20 mg every other day.  We will see her back in 1 month with CBC, CMP, CEA and CT chest to reassess her disease baseline.  Patient and her daughter-in-law all verbalized understanding of and agreement to the plan.  She knows to call the office should any new questions or concerns arise.   I provided 20 minutes of face-to-face time during this this encounter and > 50% was spent counseling as documented under my assessment and plan.    Derwood Kaplan, MD [facility] AT [facility] [address] [place] [address] Dept: [phone] Dept Fax: [phone]   I, Rita Ohara, am acting as scribe for Derwood Kaplan, MD  I have reviewed this report as typed by the medical scribe, and it is complete and accurate.

## 2020-04-27 ENCOUNTER — Inpatient Hospital Stay: Payer: Medicare Other | Attending: Oncology | Admitting: Oncology

## 2020-04-27 ENCOUNTER — Other Ambulatory Visit: Payer: Self-pay | Admitting: Oncology

## 2020-04-27 ENCOUNTER — Other Ambulatory Visit: Payer: Self-pay

## 2020-04-27 ENCOUNTER — Other Ambulatory Visit: Payer: Self-pay | Admitting: Hematology and Oncology

## 2020-04-27 ENCOUNTER — Telehealth: Payer: Self-pay | Admitting: Oncology

## 2020-04-27 ENCOUNTER — Inpatient Hospital Stay: Payer: Medicare Other

## 2020-04-27 ENCOUNTER — Encounter: Payer: Self-pay | Admitting: Oncology

## 2020-04-27 VITALS — BP 193/80 | HR 78 | Temp 98.0°F | Resp 18 | Ht 60.0 in | Wt 124.9 lb

## 2020-04-27 DIAGNOSIS — C3491 Malignant neoplasm of unspecified part of right bronchus or lung: Secondary | ICD-10-CM | POA: Diagnosis not present

## 2020-04-27 DIAGNOSIS — Z853 Personal history of malignant neoplasm of breast: Secondary | ICD-10-CM | POA: Diagnosis not present

## 2020-04-27 DIAGNOSIS — M545 Low back pain, unspecified: Secondary | ICD-10-CM | POA: Diagnosis not present

## 2020-04-27 DIAGNOSIS — Z7901 Long term (current) use of anticoagulants: Secondary | ICD-10-CM | POA: Insufficient documentation

## 2020-04-27 DIAGNOSIS — Z79899 Other long term (current) drug therapy: Secondary | ICD-10-CM | POA: Diagnosis not present

## 2020-04-27 DIAGNOSIS — E860 Dehydration: Secondary | ICD-10-CM | POA: Insufficient documentation

## 2020-04-27 DIAGNOSIS — I10 Essential (primary) hypertension: Secondary | ICD-10-CM | POA: Insufficient documentation

## 2020-04-27 DIAGNOSIS — C3411 Malignant neoplasm of upper lobe, right bronchus or lung: Secondary | ICD-10-CM | POA: Diagnosis not present

## 2020-04-27 DIAGNOSIS — M810 Age-related osteoporosis without current pathological fracture: Secondary | ICD-10-CM | POA: Diagnosis not present

## 2020-04-27 DIAGNOSIS — K224 Dyskinesia of esophagus: Secondary | ICD-10-CM | POA: Insufficient documentation

## 2020-04-27 DIAGNOSIS — Z86711 Personal history of pulmonary embolism: Secondary | ICD-10-CM | POA: Insufficient documentation

## 2020-04-27 DIAGNOSIS — E871 Hypo-osmolality and hyponatremia: Secondary | ICD-10-CM | POA: Insufficient documentation

## 2020-04-27 DIAGNOSIS — D649 Anemia, unspecified: Secondary | ICD-10-CM | POA: Diagnosis not present

## 2020-04-27 DIAGNOSIS — G8929 Other chronic pain: Secondary | ICD-10-CM | POA: Diagnosis not present

## 2020-04-27 LAB — CBC
Absolute Lymphocytes: 0.43 — AB (ref 0.65–4.75)
MCV: 88 (ref 81–99)

## 2020-04-27 NOTE — Telephone Encounter (Signed)
Per 2/17 los next appt sched and given to patient

## 2020-04-28 LAB — CEA: CEA: 298 ng/mL — ABNORMAL HIGH (ref 0.0–4.7)

## 2020-05-01 DIAGNOSIS — S72141A Displaced intertrochanteric fracture of right femur, initial encounter for closed fracture: Secondary | ICD-10-CM | POA: Diagnosis not present

## 2020-05-03 ENCOUNTER — Telehealth: Payer: Self-pay

## 2020-05-03 NOTE — Telephone Encounter (Addendum)
Pt notified, and verbalized understanding.    ----- Message from Derwood Kaplan, MD sent at 05/03/2020  4:17 PM EST ----- Regarding: RE: Pt asking for cancer markr result It went up another 100 points but she still feels well and not time for another scan just yet ----- Message ----- From: Dairl Ponder, RN Sent: 05/03/2020   3:40 PM EST To: Derwood Kaplan, MD Subject: Pt asking for cancer markr result              Pt calling to get cancer marker result. [phone]

## 2020-05-03 NOTE — Telephone Encounter (Signed)
I sent an In basket message to Dr Hinton Rao regarding this @ [phone]

## 2020-05-16 DIAGNOSIS — I10 Essential (primary) hypertension: Secondary | ICD-10-CM | POA: Diagnosis not present

## 2020-05-17 DIAGNOSIS — Z808 Family history of malignant neoplasm of other organs or systems: Secondary | ICD-10-CM | POA: Diagnosis not present

## 2020-05-17 DIAGNOSIS — Z803 Family history of malignant neoplasm of breast: Secondary | ICD-10-CM | POA: Diagnosis not present

## 2020-05-17 DIAGNOSIS — Z853 Personal history of malignant neoplasm of breast: Secondary | ICD-10-CM | POA: Diagnosis not present

## 2020-05-17 DIAGNOSIS — Z1379 Encounter for other screening for genetic and chromosomal anomalies: Secondary | ICD-10-CM | POA: Diagnosis not present

## 2020-05-22 ENCOUNTER — Encounter: Payer: Self-pay | Admitting: Oncology

## 2020-05-23 DIAGNOSIS — K449 Diaphragmatic hernia without obstruction or gangrene: Secondary | ICD-10-CM | POA: Diagnosis not present

## 2020-05-23 DIAGNOSIS — M47814 Spondylosis without myelopathy or radiculopathy, thoracic region: Secondary | ICD-10-CM | POA: Diagnosis not present

## 2020-05-23 DIAGNOSIS — C342 Malignant neoplasm of middle lobe, bronchus or lung: Secondary | ICD-10-CM | POA: Diagnosis not present

## 2020-05-23 DIAGNOSIS — I2510 Atherosclerotic heart disease of native coronary artery without angina pectoris: Secondary | ICD-10-CM | POA: Diagnosis not present

## 2020-05-23 DIAGNOSIS — J984 Other disorders of lung: Secondary | ICD-10-CM | POA: Diagnosis not present

## 2020-05-23 DIAGNOSIS — C50312 Malignant neoplasm of lower-inner quadrant of left female breast: Secondary | ICD-10-CM | POA: Diagnosis not present

## 2020-05-23 LAB — CBC AND DIFFERENTIAL
HCT: 36 (ref 36–46)
Hemoglobin: 11.9 — AB (ref 12.0–16.0)
Neutrophils Absolute: 3.02
Platelets: 152 (ref 150–399)
WBC: 4.5

## 2020-05-23 LAB — BASIC METABOLIC PANEL
BUN: 20 (ref 4–21)
CO2: 25 — AB (ref 13–22)
Chloride: 97 — AB (ref 99–108)
Creatinine: 0.8 (ref 0.5–1.1)
Glucose: 93
Potassium: 4.6 (ref 3.4–5.3)
Sodium: 128 — AB (ref 137–147)

## 2020-05-23 LAB — HEPATIC FUNCTION PANEL
ALT: 16 (ref 7–35)
AST: 29 (ref 13–35)
Alkaline Phosphatase: 38 (ref 25–125)
Bilirubin, Total: 0.5

## 2020-05-23 LAB — COMPREHENSIVE METABOLIC PANEL
Albumin: 4.4 (ref 3.5–5.0)
Calcium: 8.8 (ref 8.7–10.7)

## 2020-05-23 LAB — CBC: RBC: 4.09 (ref 3.87–5.11)

## 2020-05-24 NOTE — Progress Notes (Incomplete)
[facility]  [address] [place],  [place]  [address] [phone]  Clinic Day:  05/24/2020  Referring physician: Townsend Roger, MD   This document serves as a record of services personally performed by Hosie Poisson, MD. It was created on their behalf by [facility], a trained medical scribe. The creation of this record is based on the scribe's personal observations and the provider's statements to them.  CHIEF COMPLAINT:  CC: Stage IVA non-small cell lung cancer and history of stage IA hormone receptor positive left breast cancer  Current Treatment:  osimertinib and denosumab  HISTORY OF PRESENT ILLNESS:  Marilyn Richard is a 87 y.o. female with stage IA (T1b N0 M0) hormone receptor positive left breast cancer diagnosed in April 2015.  She was treated with lumpectomy.  Pathology revealed a 0.8 cm, grade 1, invasive carcinoma with both ductal and lobular features and 2 negative sentinel nodes.  Estrogen receptors were positive, progesterone receptors negative and her 2 Neu negative.  She was placed on raloxifene in May 2015.  She has osteopenia, which was stable on bone density scan in January 2016.  We previously recommended testing for hereditary breast and ovarian cancer and at her last visit she agreed to testing.  She underwent testing with [facility] myRisk Hereditary Cancer Panel test which did not reveal any clinically significant mutation or variants of uncertain significance. She had a right knee replacement in April 2017 and states she still has some problems with pain of the right knee, for which Tylenol is effective.  Bone density scan in August 2018 revealed osteopenia, which was just mildly worse by 3%.  She had a persistent respiratory infection and chest X-ray in May revealed possible pulmonary nodule.  She then had a CT chest, which revealed multiple lung lesions and enlarged lymph nodes.  PET scan in June revealed a  hypermetabolic right middle lobe mass with multiple hypermetabolic right upper lobe pulmonary nodules consistent with bronchogenic carcinoma.  There was also hypermetabolic mediastinal lymph nodes in the subcarinal region with multiple lesions of the right pleural space and a hypermetabolic lymph node adjacent to the GE junction.  There was no other metastatic disease in the abdomen.  There was a single hypermetabolic focus in the S1 vertebral body.  CT guided biopsy of the lung was done in June.  Pathology revealed non-small cell carcinoma consistent with primary lung adenocarcinoma.  She therefore has a stage IVA (T3 N3 M1c) non-small cell lung cancer.  PDL1 was positive.  Foundation 1 testing revealed positive EGFR mutation, [id number].  Due to the EGFR mutation, we recommended palliative oral chemotherapy with osimertinib.  She had a repeat CT chest in July, which revealed significant worsening, so this is a more aggressive lung cancer than we originally thought.  The right middle lobe mass had increased from 3 cm to 4.9 cm in less than 2 months time and she had increased pulmonary and pleural based metastases as well as a new moderate sized right pleural effusion and progressive thoracic adenopathy.  She was declining rapidly.  Also, the CEA had nearly doubled in a short time up to 279 in July.  Due to her advanced age, we started osimertinib 40 mg once daily on July 20th, which is 50% of the standard dose.  Since starting osimertinib, she had improvement in her breathing and right chest pain.  She tolerated osimertinib 40 mg daily well, so it was increased to twice daily on August  3rd.  She has had intermittent diarrhea and hyponatremia during therapy. She also had mild leukopenia and anemia.  She was seen on August 26th due to persistent diarrhea despite increasing Lomotil to 6 tablets daily. Stool was positive for for Campylobacter.  The patient was placed on azithromycin 500 mg daily for 5 days.  Due to  dehydration, Dr. Nona Dell changed her losartan/hydrochlorothiazide to losartan alone.  We resumed osimertinib 40 mg daily in September.  The CEA had decreased to 77 by that time.    When she was seen on October 28th, 2020, she had worsening rash especially of the face and neck.  She was placed on minocycline 100 mg twice daily, as well as clindamycin gel to use topically 3 times daily as needed for her rash which is associated with osimertinib.  The CEA had decreased to 35. CT chest, abdomen and pelvis in December of 2020 revealed marked improvement of the prior right middle lobe mass now measuring 9 x 12 mm, previously 3.0 x 4.9 cm and just mild residual right pleural effusion.  There was no evidence of metastatic disease.  The CEA was down to 5.2 in February.  We asked her to start calcium 600 mg daily for osteopenia.  Annual bilateral mammogram from April 8th was clear.  CT chest, abdomen and pelvis from April 15th revealed the nodule of the medial segment right middle lobe to be stable at 1.3 x 0.9 cm.  CEA in April was 5.3, and mildly increased to 5.6 in May. She fractured her right hip back in June and underwent surgery.  Bone density scan from June 14th revealed osteoporosis of the right forearm with a T-score of -2.9.  The left femur neck measures -1.7, previously -1.6, and considered osteopenic.  Dual femur total mean measures -0.9, previously -0.5, and considered normal.  CT chest from September 7th revealed moderate burden of bilateral upper and lower lobe pulmonary emboli.  There are no findings for significant right heart strain.  She has stable to slightly smaller right middle lobe pulmonary lesion, now measuring 12 x 8.5 mm, previously 13 x 9 mm.  She was contacted to begin anticoagulation ASAP, but preferred not to go with Lovenox injections.  She was placed on Xarelto 15 mg twice daily.  CEA was 15.5, down from 16.5 in August.  She completed 5 years of raloxifene so this was discontinued in  September.   She was placed on Prolia and had her 1st dose in October.  Her CEA increased to 37 in October and then 68.8 in November and 107 in December.  CT chest from December 14th revealed interval resolution of the peripheral left upper lobe ground-glass opacity identified as new on the previous exam.  There is no substantial change medial right middle lobe pulmonary lesion measuring 14 mm, previously 12 mm, and no new suspicious pulmonary nodule or mass.   INTERVAL HISTORY:  Christa is here for routine follow up after presenting to the emergency department on February 15th due to chest pain.  She described the feeling as if food was stuck so that she would be unable to pass anything.  Her pain worsened with liquids.  By the time she was seen for evaluation, the sensation had subsided, but she reports that this continued to occur intermittently throughout the rest of the day.  Chest x-ray was negative.  Blood counts and chemistries were unremarkable except for a hemoglobin of 11.8 and a sodium of 129.  Her symptoms were  felt to represent esophageal spasms.  Her chest remains sore from this episode.  She did have a choking episode on Tuesday and also picked up her 28 pound grandson the other day.  She was hypertensive in the emergency room and remains this way today.  She notes that her pressure at home has been normal, but she will keep a log to discuss with Dr. Nona Dell.  She continues osimertinib daily without difficulty.  Her  appetite is good, and she has gained nearly 1 pound since her last visit.  She denies fever, chills or other signs of infection.  She denies nausea, vomiting, bowel issues, or abdominal pain.  She denies sore throat, cough, dyspnea, or chest pain today.  She is accompanied by her daughter-in-law.  Maegan is here for routine follow up and to review imaging results.  CT chest, abdomen and pelvis from March 15th revealed significantly increased right pleural effusion, with equivocal  enhancement along the right parietal pleural margin. There is also some increased nodularity along the major fissure.  She continues osimertinib twice daily without difficulty.   Her  appetite is good, and she has gained/lost _ pounds since her last visit.  She denies fever, chills or other signs of infection.  She denies nausea, vomiting, bowel issues, or abdominal pain.  She denies sore throat, cough, dyspnea, or chest pain.  REVIEW OF SYSTEMS:  Review of Systems  Constitutional: Negative.  Negative for appetite change, chills, fatigue, fever and unexpected weight change.  HENT:  Negative.   Eyes: Negative.   Respiratory: Negative.  Negative for chest tightness, cough, hemoptysis, shortness of breath and wheezing.   Cardiovascular: Positive for chest pain (intermittent, due to esophageal spasms). Negative for leg swelling and palpitations.  Gastrointestinal: Negative.  Negative for abdominal distention, abdominal pain, blood in stool, constipation, diarrhea, nausea and vomiting.  Endocrine: Negative.   Genitourinary: Negative.  Negative for dysuria, frequency and hematuria.   Musculoskeletal: Positive for arthralgias, back pain (chronic) and gait problem (uses a walker to ambulate). Negative for flank pain, myalgias, neck pain and neck stiffness.  Skin: Negative.   Neurological: Positive for gait problem (uses a walker to ambulate). Negative for dizziness, extremity weakness, headaches, light-headedness, numbness, seizures and speech difficulty.  Hematological: Negative.   Psychiatric/Behavioral: Negative.  Negative for depression and sleep disturbance. The patient is not nervous/anxious.      VITALS:  There were no vitals taken for this visit.  Wt Readings from Last 3 Encounters:  04/27/20 124 lb 14.4 oz (56.7 kg)  03/29/20 124 lb (56.2 kg)  02/24/20 124 lb 12.8 oz (56.6 kg)    There is no height or weight on file to calculate BMI.  Performance status (ECOG): 1 - Symptomatic but  completely ambulatory  PHYSICAL EXAM:  Physical Exam  LABS:   CBC Latest Ref Rng & Units 04/25/2020 03/29/2020 02/22/2020  WBC - 7.2 4.9 4.3  Hemoglobin 12.0 - 16.0 11.8(A) 12.3 12.3  Hematocrit 36 - 46 35(A) 37 37  Platelets 150 - 399 140(A) 164 131(A)   CMP Latest Ref Rng & Units 04/25/2020 03/29/2020 02/22/2020  Glucose 70 - 99 mg/dL - - -  BUN 4 - 21 18 23(A) 18  Creatinine 0.5 - 1.1 0.7 1.1 1.0  Sodium 137 - 147 129(A) 133(A) 134(A)  Potassium 3.4 - 5.3 4.3 4.3 4.2  Chloride 99 - 108 97(A) 102 101  CO2 13 - 22 24(A) 25(A) 24(A)  Calcium 8.7 - 10.7 8.8 9.2 9.2  Total  Protein 6.5 - 8.1 g/dL - - -  Total Bilirubin 0.3 - 1.2 mg/dL - - -  Alkaline Phos 25 - 125 36 30 39  AST 13 - 35 '25 28 28  ' ALT 7 - 35 '16 16 14     ' Lab Results  Component Value Date   CEA1 298.0 (H) 04/27/2020   /  CEA  Date Value Ref Range Status  04/27/2020 298.0 (H) 0.0 - 4.7 ng/mL Final    Comment:    (NOTE)                             Nonsmokers          <3.9                             Smokers             <5.6 [employer] Electrochemiluminescence Immunoassay (ECLIA) Values obtained with different assay methods or kits cannot be used interchangeably.  Results cannot be interpreted as absolute evidence of the presence or absence of malignant disease. Performed At: [facility] [place], [place] [id number] Rush Farmer MD JG:[id number]     STUDIES:   EXAM: 05/23/2020 CT CHEST WITH CONTRAST  TECHNIQUE: Multidetector CT imaging of the chest was performed during intravenous contrast administration.  CONTRAST:  60 cc Isovue 370  COMPARISON:  02/22/2020  FINDINGS: Cardiovascular: Coronary, aortic arch, and branch vessel atherosclerotic vascular disease. Borderline cardiomegaly.  Mediastinum/Nodes: Small type 1 hiatal hernia. No pathologic adenopathy is identified.  Lungs/Pleura: For substantially increased right pleural effusion. Equivocal enhancement  along the right parietal pleural margin for example medially on image 91 series 2, cannot exclude exudative effusion.  Stable scarring anteriorly in the right upper lobe. Faint but mildly progressive nodularity along the major fissure superiorly.  Right middle lobe nodularity with some high density component, measuring 2.2 by 1.4 cm on image 78 series 4, previously 1.9 by 1.2 cm by my measurements. Questionable adjacent loculated pleural fluid.  Densely calcified stable 5 by 3 mm left lower lobe nodule on image 68 of series 4.  Upper Abdomen: Mild prominence of the extrahepatic biliary tree, unchanged. Upper abdominal aortic atherosclerotic vascular calcification.  Musculoskeletal: Thoracic and lower cervical spondylosis and degenerative disc disease.  IMPRESSION: 1. Significantly increased right pleural effusion, with equivocal enhancement along the right parietal pleural margin. There is also some increased nodularity along the major fissure. Exudative or malignant effusion not excluded. 2. Other imaging findings of potential clinical significance: Coronary, aortic arch, and branch vessel atherosclerotic vascular disease. Borderline cardiomegaly. Small type 1 hiatal hernia. Stable scarring anteriorly in the right upper lobe. Mild prominence of the extrahepatic biliary tree, unchanged. Thoracic spondylosis and degenerative disc disease. 3. Aortic atherosclerosis.  Aortic Atherosclerosis (ICD10-I70.0).   Allergies:  Allergies  Allergen Reactions  . Wasp Venom Anaphylaxis  . Keflex [Cephalexin] Rash  . Levofloxacin In D5w Other (See Comments)    Caused issues with muscles in her arms.  Caused issues with muscles in her arms.   Tiajuana Amass [Loracarbef] Hives    Current Medications: Current Outpatient Medications  Medication Sig Dispense Refill  . acetaminophen (TYLENOL 8 HOUR) 650 MG CR tablet Take 500 mg by mouth every 8 (eight) hours as needed for pain. 2-546m twice  a day    . ARTIFICIAL TEAR OP Apply to eye.    . Ascorbic Acid (  VITAMIN C WITH ROSE HIPS) 500 MG tablet Take 500 mg by mouth daily.    . citalopram (CELEXA) 10 MG tablet Take 10 mg by mouth daily.    . clindamycin (CLINDAGEL) 1 % gel Apply topically 3 (three) times daily.    . diclofenac sodium (VOLTAREN) 1 % GEL 3 grams tid prn 3 Tube 3  . fenofibrate 160 MG tablet     . fluticasone (FLONASE) 50 MCG/ACT nasal spray Place into both nostrils as needed.     Marland Kitchen levothyroxine (SYNTHROID) 50 MCG tablet Take 50 mcg by mouth daily.    Marland Kitchen losartan (COZAAR) 100 MG tablet Take 100 mg by mouth daily.    . meclizine (ANTIVERT) 25 MG tablet Take by mouth as needed.     . minocycline (MINOCIN) 100 MG capsule Take 100 mg by mouth daily as needed.    . Multiple Vitamins-Minerals (MULTIVITAMIN WITH MINERALS) tablet Take 1 tablet by mouth daily.    Marland Kitchen osimertinib mesylate (TAGRISSO) 40 MG tablet Take 40 mg by mouth in the morning and at bedtime.    . rivaroxaban (XARELTO) 20 MG TABS tablet Take 20 mg by mouth every other day.    . Vitamin D, Cholecalciferol, 1000 units CAPS Take 2,000 Units by mouth daily.     . vitamin E 400 UNIT capsule Take 400 Units by mouth daily.     No current facility-administered medications for this visit.     ASSESSMENT & PLAN:   Assessment:   1. History of stage IA hormone receptor positive left breast cancer April 2015 treated with lumpectomy.  She was placed on raloxifene was on that for over 5 years.  She has discontinued this oral therapy.  2. Osteoporosis.  Her last bone density from June 2021 revealed worsening and she is now considered osteoporotic.  She continues raloxifene, and we discussed further treatment of her bones today.  She has been placed on Prolia every 6 months, and received her 1st dose in October.  She will be due again in April.  3. Stage IVA non-small cell lung cancer, which is most consistent with adenocarcinoma.  EGFR and PDL1 were positive.  She is on  palliative oral chemotherapy with osimertinib 40 mg twice daily.  The CEA had decreased steadily, but has now increased dramiatically.  Most recent CT imaging in December is fairly stable, so we have decided to continue her present therapy.     4. Hyponatremia, mildly worse.    5. Dehydration, improved.    6. Previous Campylobacter infection.  She remains without evidence of recurrence  7. Chronic low back and hip pain, which in part may be secondary to the S1 metastasis, which is stable and controlled with Voltaren gel and Tylenol.    8. Rash felt to be secondary to osimertinib.  She has minimal rash today while on minocycline and so she can stay on 1 pill daily.  9. Hip fracture in June 2021 requiring surgery.  She is getting around well with the assistance of a walker.    10. Bilateral upper and lower lobe pulmonary emboli, without significant right heart strain.  She has since been placed on Xarelto starting at 15 mg twice daily, then 20 mg once daily.  I explained that she will likely need to stay on this medication for the rest of her life as she will be at a higher risk for thrombosis.  This has resolved on current CT imaging.  She may decrease her Xarelto to 20  mg every other day as she has had increased bleeding.  11.  Intermittent esophageal spasms, resulting in a trip to the emergency room on February 15th.    Plan: The patient knows to continue with twice daily osimertinib.  She continues Xarelto 20 mg every other day.  We will see her back in 1 month with CBC, CMP, CEA and CT chest to reassess her disease baseline.  Patient and her daughter-in-law all verbalized understanding of and agreement to the plan.  She knows to call the office should any new questions or concerns arise.   I provided 20 minutes of face-to-face time during this this encounter and > 50% was spent counseling as documented under my assessment and plan.    Derwood Kaplan, MD [facility] [address] [place] [address] Dept: [phone] Dept Fax: [phone]   I, Rita Ohara, am acting as scribe for Derwood Kaplan, MD  I have reviewed this report as typed by the medical scribe, and it is complete and accurate.

## 2020-05-25 ENCOUNTER — Other Ambulatory Visit: Payer: Self-pay | Admitting: Hematology and Oncology

## 2020-05-25 ENCOUNTER — Inpatient Hospital Stay: Payer: Medicare Other | Admitting: Oncology

## 2020-05-25 LAB — CBC: MCV: 89 (ref 81–99)

## 2020-05-25 LAB — CEA: CEA: 484 — AB (ref 0–4.7)

## 2020-05-25 NOTE — Progress Notes (Signed)
[place]  [address] [place],  [place]  [address] [phone]  Clinic Day:  05/26/2020  Referring physician: Townsend Roger, MD   This document serves as a record of services personally performed by Hosie Poisson, MD. It was created on their behalf by [facility], a trained medical scribe. The creation of this record is based on the scribe's personal observations and the provider's statements to them.   CHIEF COMPLAINT:  CC: Stage IVA non-small cell lung cancer and history of stage IA hormone receptor positive left breast cancer  Current Treatment:  osimertinib and denosumab   HISTORY OF PRESENT ILLNESS:  Marilyn Richard is a 87 y.o. female with stage IA (T1b N0 M0) hormone receptor positive left breast cancer diagnosed in April 2015.  She was treated with lumpectomy.  Pathology revealed a 0.8 cm, grade 1, invasive carcinoma with both ductal and lobular features and 2 negative sentinel nodes.  Estrogen receptors were positive, progesterone receptors negative and her 2 Neu negative.  She was placed on raloxifene in May 2015.  She has osteopenia, which was stable on bone density scan in January 2016.  We previously recommended testing for hereditary breast and ovarian cancer and at her last visit she agreed to testing.  She underwent testing with [facility] myRisk Hereditary Cancer Panel test which did not reveal any clinically significant mutation or variants of uncertain significance. She had a right knee replacement in April 2017 and states she still has some problems with pain of the right knee, for which Tylenol is effective.  Bone density scan in August 2018 revealed osteopenia, which was just mildly worse by 3%.  She had a persistent respiratory infection and chest X-ray in May revealed possible pulmonary nodule.  She then had a CT chest, which revealed multiple lung lesions and enlarged lymph nodes.  PET scan in June revealed a  hypermetabolic right middle lobe mass with multiple hypermetabolic right upper lobe pulmonary nodules consistent with bronchogenic carcinoma.  There was also hypermetabolic mediastinal lymph nodes in the subcarinal region with multiple lesions of the right pleural space and a hypermetabolic lymph node adjacent to the GE junction.  There was no other metastatic disease in the abdomen.  There was a single hypermetabolic focus in the S1 vertebral body.  CT guided biopsy of the lung was done in June.  Pathology revealed non-small cell carcinoma consistent with primary lung adenocarcinoma.  She therefore has a stage IVA (T3 N3 M1c) non-small cell lung cancer.  PDL1 was positive.  Foundation 1 testing revealed positive EGFR mutation, [id number].  Due to the EGFR mutation, we recommended palliative oral chemotherapy with osimertinib.  She had a repeat CT chest in July, which revealed significant worsening, so this is a more aggressive lung cancer than we originally thought.  The right middle lobe mass had increased from 3 cm to 4.9 cm in less than 2 months time and she had increased pulmonary and pleural based metastases as well as a new moderate sized right pleural effusion and progressive thoracic adenopathy.  She was declining rapidly.  Also, the CEA had nearly doubled in a short time up to 279 in July.  Due to her advanced age, we started osimertinib 40 mg once daily on July 20th, which is 50% of the standard dose.  Since starting osimertinib, she had improvement in her breathing and right chest pain.  She tolerated osimertinib 40 mg daily well, so it was increased to twice daily  on August 3rd.  She has had intermittent diarrhea and hyponatremia during therapy. She also had mild leukopenia and anemia.  She was seen on August 26th due to persistent diarrhea despite increasing Lomotil to 6 tablets daily. Stool was positive for for Campylobacter.  The patient was placed on azithromycin 500 mg daily for 5 days.  Due to  dehydration, Dr. Nona Dell changed her losartan/hydrochlorothiazide to losartan alone.  We resumed osimertinib 40 mg daily in September.  The CEA had decreased to 77 by that time.    When she was seen on October 28th, 2020, she had worsening rash especially of the face and neck.  She was placed on minocycline 100 mg twice daily, as well as clindamycin gel to use topically 3 times daily as needed for her rash which is associated with osimertinib.  The CEA had decreased to 35. CT chest, abdomen and pelvis in December of 2020 revealed marked improvement of the prior right middle lobe mass now measuring 9 x 12 mm, previously 3.0 x 4.9 cm and just mild residual right pleural effusion.  There was no evidence of metastatic disease.  The CEA was down to 5.2 in February.  We asked her to start calcium 600 mg daily for osteopenia.  Annual bilateral mammogram from April 8th was clear.  CT chest, abdomen and pelvis from April 15th revealed the nodule of the medial segment right middle lobe to be stable at 1.3 x 0.9 cm.  CEA in April was 5.3, and mildly increased to 5.6 in May. She fractured her right hip back in June and underwent surgery.  Bone density scan from June 14th revealed osteoporosis of the right forearm with a T-score of -2.9.  The left femur neck measures -1.7, previously -1.6, and considered osteopenic.  Dual femur total mean measures -0.9, previously -0.5, and considered normal.  CT chest from September 7th revealed moderate burden of bilateral upper and lower lobe pulmonary emboli.  There are no findings for significant right heart strain.  She has stable to slightly smaller right middle lobe pulmonary lesion, now measuring 12 x 8.5 mm, previously 13 x 9 mm.  She was contacted to begin anticoagulation ASAP, but preferred not to go with Lovenox injections.  She was placed on Xarelto 15 mg twice daily.  CEA was 15.5, down from 16.5 in August.  She completed 5 years of raloxifene so this was discontinued in  September.   She was placed on Prolia and had her 1st dose in October.  Her CEA increased to 37 in October and then 68.8 in November and 107 in December.  CT chest from December 14th revealed interval resolution of the peripheral left upper lobe ground-glass opacity identified as new on the previous exam.  There is no substantial change medial right middle lobe pulmonary lesion measuring 14 mm, previously 12 mm, and no new suspicious pulmonary nodule or mass.   INTERVAL HISTORY:  Marilyn Richard is here for routine follow up and to review imaging results.  CT chest, abdomen and pelvis from March 15th reveled significantly increased right pleural effusion, with equivocal enhancement along the right parietal pleural margin. There is also some increased nodularity along the right middle lobe measuring 2.2 x 1.4 cm, previously 1.9 x 1.2 cm.  She continues twice daily osimertinib.  She still has a mild rash of the face from treatment.  She states that she is doing fairly well, but her shortness of breath has mildly worsened.  She continues Xarelto every other  day, and for now I suggest she discontinue this medication.  She has now been placed back on hydralazine 50 mg, 1/2 a tablet BID.  Her hemoglobin is nearly normal at 11.9, and her white count and platelets are normal.  Chemistries are unremarkable except for a sodium of 128, worse, and a BUN of 20, improved.  She knows to increase the salt in her diet.  CEA from March is 484, previously 298 in February.  Her  appetite is good, and her weight is stable since her last visit.  She denies fever, chills or other signs of infection.  She denies nausea, vomiting, bowel issues, or abdominal pain.  She denies sore throat, cough, dyspnea, or chest pain.  REVIEW OF SYSTEMS:  Review of Systems  Constitutional: Negative.  Negative for appetite change, chills, fatigue, fever and unexpected weight change.  HENT:  Negative.   Eyes: Negative.   Respiratory: Positive for shortness  of breath (stable). Negative for chest tightness, cough, hemoptysis and wheezing.   Cardiovascular: Negative.  Negative for chest pain, leg swelling and palpitations.  Gastrointestinal: Negative.  Negative for abdominal distention, abdominal pain, blood in stool, constipation, diarrhea, nausea and vomiting.  Endocrine: Negative.   Genitourinary: Negative.  Negative for difficulty urinating, dysuria, frequency and hematuria.   Musculoskeletal: Negative.  Negative for arthralgias, back pain, flank pain, gait problem and myalgias.  Skin: Negative.   Neurological: Negative.  Negative for dizziness, extremity weakness, gait problem, headaches, light-headedness, numbness, seizures and speech difficulty.  Hematological: Negative.   Psychiatric/Behavioral: Negative.  Negative for depression and sleep disturbance. The patient is not nervous/anxious.      VITALS:  Blood pressure (!) 188/76, pulse 73, temperature 98 F (36.7 C), temperature source Oral, resp. rate 18, height 5' (1.524 m), weight 125 lb (56.7 kg), SpO2 98 %.  Wt Readings from Last 3 Encounters:  05/26/20 125 lb (56.7 kg)  04/27/20 124 lb 14.4 oz (56.7 kg)  03/29/20 124 lb (56.2 kg)    Body mass index is 24.41 kg/m.  Performance status (ECOG): 1 - Symptomatic but completely ambulatory  PHYSICAL EXAM:  Physical Exam Constitutional:      General: She is not in acute distress.    Appearance: Normal appearance. She is normal weight.  HENT:     Head: Normocephalic and atraumatic.  Eyes:     General: No scleral icterus.    Extraocular Movements: Extraocular movements intact.     Conjunctiva/sclera: Conjunctivae normal.     Pupils: Pupils are equal, round, and reactive to light.  Cardiovascular:     Rate and Rhythm: Normal rate and regular rhythm.     Pulses: Normal pulses.     Heart sounds: Normal heart sounds. No murmur heard. No friction rub. No gallop.   Pulmonary:     Effort: Pulmonary effort is normal. No respiratory  distress.     Breath sounds: Decreased breath sounds (at the right base) and rales (at the left base) present.  Abdominal:     General: Bowel sounds are normal. There is no distension.     Palpations: Abdomen is soft. There is no hepatomegaly, splenomegaly or mass.     Tenderness: There is no abdominal tenderness.  Musculoskeletal:        General: Normal range of motion.     Cervical back: Normal range of motion and neck supple.     Right lower leg: No edema.     Left lower leg: No edema.  Lymphadenopathy:  Cervical: No cervical adenopathy.  Skin:    General: Skin is warm and dry.  Neurological:     General: No focal deficit present.     Mental Status: She is alert and oriented to person, place, and time. Mental status is at baseline.  Psychiatric:        Mood and Affect: Mood normal.        Behavior: Behavior normal.        Thought Content: Thought content normal.        Judgment: Judgment normal.     LABS:   CBC Latest Ref Rng & Units 05/23/2020 04/25/2020 03/29/2020  WBC - 4.5 7.2 4.9  Hemoglobin 12.0 - 16.0 11.9(A) 11.8(A) 12.3  Hematocrit 36 - 46 36 35(A) 37  Platelets 150 - 399 152 140(A) 164   CMP Latest Ref Rng & Units 05/23/2020 04/25/2020 03/29/2020  Glucose 70 - 99 mg/dL - - -  BUN 4 - _0 23(A)  Creatinine 0.5 - 1.1 0.8 0.7 1.1  Sodium 137 - 147 128(A) 129(A) 133(A)  Potassium 3.4 - 5.3 4.6 4.3 4.3  Chloride 99 - 108 97(A) 97(A) 102  CO2 13 - 22 25(A) 24(A) 25(A)  Calcium 8.7 - 10.7 8.8 8.8 9.2  Total Protein 6.5 - 8.1 g/dL - - -  Total Bilirubin 0.3 - 1.2 mg/dL - - -  Alkaline Phos 25 - 125 38 36 30  AST 13 - 35 _1 ALT 7 - 35 _2 Lab Results  Component Value Date   CEA1 298.0 (H) 04/27/2020   /  CEA  Date Value Ref Range Status  04/27/2020 298.0 (H) 0.0 - 4.7 ng/mL Final    Comment:    (NOTE)                             Nonsmokers          <3.9                             Smokers             <5.6 [employer]  Electrochemiluminescence Immunoassay (ECLIA) Values obtained with different assay methods or kits cannot be used interchangeably.  Results cannot be interpreted as absolute evidence of the presence or absence of malignant disease. Performed At: [facility] [place], [place] [id number] Rush Farmer MD XB:[id number]     STUDIES:   EXAM: 05/23/2020 CT CHEST WITH CONTRAST  TECHNIQUE: Multidetector CT imaging of the chest was performed during intravenous contrast administration.  CONTRAST:  60 cc Isovue 370  COMPARISON:  02/22/2020  FINDINGS: Cardiovascular: Coronary, aortic arch, and branch vessel atherosclerotic vascular disease. Borderline cardiomegaly.  Mediastinum/Nodes: Small type 1 hiatal hernia. No pathologic adenopathy is identified.  Lungs/Pleura: For substantially increased right pleural effusion. Equivocal enhancement along the right parietal pleural margin for example medially on image 91 series 2, cannot exclude exudative effusion.  Stable scarring anteriorly in the right upper lobe. Faint but mildly progressive nodularity along the major fissure superiorly.  Right middle lobe nodularity with some high density component, measuring 2.2 by 1.4 cm on image 78 series 4, previously 1.9 by 1.2 cm by my measurements. Questionable adjacent loculated pleural fluid.  Densely calcified stable 5 by 3 mm left lower lobe nodule on image 68 of series 4.  Upper Abdomen: Mild  prominence of the extrahepatic biliary tree, unchanged. Upper abdominal aortic atherosclerotic vascular calcification.  Musculoskeletal: Thoracic and lower cervical spondylosis and degenerative disc disease.  IMPRESSION: 1. Significantly increased right pleural effusion, with equivocal enhancement along the right parietal pleural margin. There is also some increased nodularity along the major fissure. Exudative or malignant effusion not excluded. 2. Other imaging  findings of potential clinical significance: Coronary, aortic arch, and branch vessel atherosclerotic vascular disease. Borderline cardiomegaly. Small type 1 hiatal hernia. Stable scarring anteriorly in the right upper lobe. Mild prominence of the extrahepatic biliary tree, unchanged. Thoracic spondylosis and degenerative disc disease. 3. Aortic atherosclerosis.  Aortic Atherosclerosis (ICD10-I70.0).   Allergies:  Allergies  Allergen Reactions  . Wasp Venom Anaphylaxis  . Keflex [Cephalexin] Rash  . Levofloxacin In D5w Other (See Comments)    Caused issues with muscles in her arms.  Caused issues with muscles in her arms.   Tiajuana Amass [Loracarbef] Hives    Current Medications: Current Outpatient Medications  Medication Sig Dispense Refill  . hydrALAZINE (APRESOLINE) 50 MG tablet Take 50 mg by mouth in the morning and at bedtime. Takes 1/2 tablet in am and 1/2 tablet in the afternoon for BP    . acetaminophen (TYLENOL 8 HOUR) 650 MG CR tablet Take 500 mg by mouth every 8 (eight) hours as needed for pain. 2-557m twice a day    . ARTIFICIAL TEAR OP Apply to eye.    . Ascorbic Acid (VITAMIN C WITH ROSE HIPS) 500 MG tablet Take 500 mg by mouth daily.    . citalopram (CELEXA) 10 MG tablet Take 10 mg by mouth daily.    . clindamycin (CLINDAGEL) 1 % gel Apply topically 3 (three) times daily.    . diclofenac sodium (VOLTAREN) 1 % GEL 3 grams tid prn 3 Tube 3  . fenofibrate 160 MG tablet     . fluticasone (FLONASE) 50 MCG/ACT nasal spray Place into both nostrils as needed.     .Marland Kitchenlevothyroxine (SYNTHROID) 50 MCG tablet Take 50 mcg by mouth daily.    .Marland Kitchenlosartan (COZAAR) 100 MG tablet Take 100 mg by mouth daily.    . meclizine (ANTIVERT) 25 MG tablet Take by mouth as needed.     . minocycline (MINOCIN) 100 MG capsule Take 100 mg by mouth daily as needed.    . Multiple Vitamins-Minerals (MULTIVITAMIN WITH MINERALS) tablet Take 1 tablet by mouth daily.    .Marland Kitchenosimertinib mesylate (TAGRISSO) 40  MG tablet Take 40 mg by mouth in the morning and at bedtime.    . rivaroxaban (XARELTO) 20 MG TABS tablet Take 20 mg by mouth every other day.    . Vitamin D, Cholecalciferol, 1000 units CAPS Take 2,000 Units by mouth daily.     . vitamin E 400 UNIT capsule Take 400 Units by mouth daily.     No current facility-administered medications for this visit.     ASSESSMENT & PLAN:   Assessment:   1. History of stage IA hormone receptor positive left breast cancer April 2015 treated with lumpectomy.  She was placed on raloxifene was on that for over 5 years.  She has discontinued this oral therapy.  2. Osteoporosis.  Her last bone density from June 2021 revealed worsening and she is now considered osteoporotic.  We placed her on Prolia, but we are planning to stop therapy.  3. Stage IVA non-small cell lung cancer, which is most consistent with adenocarcinoma.  EGFR and PDL1 were positive.  She is on  palliative oral chemotherapy with osimertinib 40 mg twice daily.  The CEA had decreased steadily, but has now increased dramatically, last measuring 484.0.  Most recent CT imaging in March reveled significantly increased right pleural effusion, with equivocal enhancement along the right parietal pleural margin. There is also some increased nodularity of the right middle lobe measuring 2.2 x 1.4 cm, previously 1.9 x 1.2 cm.    4. Hyponatremia, mildly worse.    5. Dehydration, improved.    6. Chronic low back and hip pain, which in part may be secondary to the S1 metastasis, which is stable and controlled with Voltaren gel and Tylenol.    7. Rash felt to be secondary to osimertinib.  She has minimal rash today while on minocycline and so she can stay on 1 pill daily.  8. Hip fracture in June 2021 requiring surgery.  She is getting around well with the assistance of a walker.    9. Bilateral upper and lower lobe pulmonary emboli, without significant right heart strain, September 2021.  She was placed on  Xarelto and she completed 6 months so we will stop it at this time.  She is at increased risk for future thrombosis, but may end up needing thoracentesis or chest tube and so we will keep her off anticoagulation.  10.  Worsening right pleural effusion.  I explained she might need thoracentesis and/or PleurX catheter.  I explained these procedures to her and we will only do so if her symptoms progress. I will ask Hospice to have oxygen available.  Plan: CT imaging revealed reveled significantly increased right pleural effusion, with equivocal enhancement along the right parietal pleural margin. There is also some increased nodularity of the right middle lobe measuring 2.2 x 1.4 cm, previously 1.9 x 1.2 cm.  The patient continues twice daily osimertinib and she will complete her current prescription, but we will discontinue this therapy after that.  Currently, she is fairly asymptomatic other than mild shortness of breath.  We discussed possible thoracentesis, and she may eventually benefit from a PleurX catheter.  At this time, we will hold off on both, but she will contact us if her symptoms worsen.  Code status was reviewed, and she does not wish to be resuscitated in the case of cardiopulmonary arrest, so a DNR form was filled out today.  Hospice care and their services were also reviewed and she feels that this is appropriate.  We will wean her off of Xarelto and gave her instructions on doing so.  We will cancel her Prolia injection for next month.  We will see her back in 3 weeks with CBC and CMP for repeat examination.  Patient and her daughter-in-law all verbalized understanding of and agreement to the plan.  She knows to call the office should any new questions or concerns arise.   I provided 30 minutes of face-to-face time during this this encounter and > 50% was spent counseling as documented under my assessment and plan.    Derwood Kaplan, MD [facility] [address] [place] [address] Dept: [phone] Dept Fax: [phone]   I, Rita Ohara, am acting as scribe for Derwood Kaplan, MD  I have reviewed this report as typed by the medical scribe, and it is complete and accurate.

## 2020-05-26 ENCOUNTER — Other Ambulatory Visit: Payer: Self-pay | Admitting: Oncology

## 2020-05-26 ENCOUNTER — Other Ambulatory Visit: Payer: Self-pay

## 2020-05-26 ENCOUNTER — Telehealth: Payer: Self-pay | Admitting: Oncology

## 2020-05-26 ENCOUNTER — Encounter: Payer: Self-pay | Admitting: Oncology

## 2020-05-26 ENCOUNTER — Inpatient Hospital Stay: Payer: Medicare Other | Attending: Oncology | Admitting: Oncology

## 2020-05-26 VITALS — BP 188/76 | HR 73 | Temp 98.0°F | Resp 18 | Ht 60.0 in | Wt 125.0 lb

## 2020-05-26 DIAGNOSIS — C3491 Malignant neoplasm of unspecified part of right bronchus or lung: Secondary | ICD-10-CM

## 2020-05-26 NOTE — Telephone Encounter (Signed)
Per 3/18 los next appt scheduled and given to patient 

## 2020-05-29 ENCOUNTER — Telehealth: Payer: Self-pay

## 2020-05-29 ENCOUNTER — Ambulatory Visit: Payer: Medicare Other | Admitting: Oncology

## 2020-05-29 NOTE — Telephone Encounter (Signed)
Referral to hospice sent 05/26/20 via fax

## 2020-05-29 NOTE — Telephone Encounter (Signed)
-----   Message from Derwood Kaplan, MD sent at 05/26/2020 10:59 AM EDT ----- Regarding: hospice Pls refer to [facility].  Non small cell lung cancer with increasing right pleural effusion.  CEA nearly 500 and fluid worsening.  She has been on Tagrisso therapy but we are stopping, I told her she could take the rest of what she has if skin rash not too bad.  She may require thoracentesis and poss PleurX if fluid is very severe.  I rec O2 We are stopping Prolia

## 2020-05-30 DIAGNOSIS — Z853 Personal history of malignant neoplasm of breast: Secondary | ICD-10-CM | POA: Diagnosis not present

## 2020-05-30 DIAGNOSIS — R590 Localized enlarged lymph nodes: Secondary | ICD-10-CM | POA: Diagnosis not present

## 2020-05-30 DIAGNOSIS — I2699 Other pulmonary embolism without acute cor pulmonale: Secondary | ICD-10-CM | POA: Diagnosis not present

## 2020-05-30 DIAGNOSIS — J90 Pleural effusion, not elsewhere classified: Secondary | ICD-10-CM | POA: Diagnosis not present

## 2020-05-30 DIAGNOSIS — C3491 Malignant neoplasm of unspecified part of right bronchus or lung: Secondary | ICD-10-CM | POA: Diagnosis not present

## 2020-05-30 DIAGNOSIS — C782 Secondary malignant neoplasm of pleura: Secondary | ICD-10-CM | POA: Diagnosis not present

## 2020-05-30 DIAGNOSIS — M810 Age-related osteoporosis without current pathological fracture: Secondary | ICD-10-CM | POA: Diagnosis not present

## 2020-05-30 DIAGNOSIS — M1990 Unspecified osteoarthritis, unspecified site: Secondary | ICD-10-CM | POA: Diagnosis not present

## 2020-05-31 DIAGNOSIS — I2699 Other pulmonary embolism without acute cor pulmonale: Secondary | ICD-10-CM | POA: Diagnosis not present

## 2020-05-31 DIAGNOSIS — C782 Secondary malignant neoplasm of pleura: Secondary | ICD-10-CM | POA: Diagnosis not present

## 2020-05-31 DIAGNOSIS — C3491 Malignant neoplasm of unspecified part of right bronchus or lung: Secondary | ICD-10-CM | POA: Diagnosis not present

## 2020-05-31 DIAGNOSIS — J90 Pleural effusion, not elsewhere classified: Secondary | ICD-10-CM | POA: Diagnosis not present

## 2020-05-31 DIAGNOSIS — Z853 Personal history of malignant neoplasm of breast: Secondary | ICD-10-CM | POA: Diagnosis not present

## 2020-05-31 DIAGNOSIS — R590 Localized enlarged lymph nodes: Secondary | ICD-10-CM | POA: Diagnosis not present

## 2020-06-01 DIAGNOSIS — J90 Pleural effusion, not elsewhere classified: Secondary | ICD-10-CM | POA: Diagnosis not present

## 2020-06-01 DIAGNOSIS — C782 Secondary malignant neoplasm of pleura: Secondary | ICD-10-CM | POA: Diagnosis not present

## 2020-06-01 DIAGNOSIS — C3491 Malignant neoplasm of unspecified part of right bronchus or lung: Secondary | ICD-10-CM | POA: Diagnosis not present

## 2020-06-01 DIAGNOSIS — Z853 Personal history of malignant neoplasm of breast: Secondary | ICD-10-CM | POA: Diagnosis not present

## 2020-06-01 DIAGNOSIS — I2699 Other pulmonary embolism without acute cor pulmonale: Secondary | ICD-10-CM | POA: Diagnosis not present

## 2020-06-01 DIAGNOSIS — R590 Localized enlarged lymph nodes: Secondary | ICD-10-CM | POA: Diagnosis not present

## 2020-06-06 DIAGNOSIS — J90 Pleural effusion, not elsewhere classified: Secondary | ICD-10-CM | POA: Diagnosis not present

## 2020-06-06 DIAGNOSIS — I2699 Other pulmonary embolism without acute cor pulmonale: Secondary | ICD-10-CM | POA: Diagnosis not present

## 2020-06-06 DIAGNOSIS — Z853 Personal history of malignant neoplasm of breast: Secondary | ICD-10-CM | POA: Diagnosis not present

## 2020-06-06 DIAGNOSIS — C782 Secondary malignant neoplasm of pleura: Secondary | ICD-10-CM | POA: Diagnosis not present

## 2020-06-06 DIAGNOSIS — R590 Localized enlarged lymph nodes: Secondary | ICD-10-CM | POA: Diagnosis not present

## 2020-06-06 DIAGNOSIS — C3491 Malignant neoplasm of unspecified part of right bronchus or lung: Secondary | ICD-10-CM | POA: Diagnosis not present

## 2020-06-07 ENCOUNTER — Telehealth: Payer: Self-pay | Admitting: Oncology

## 2020-06-07 NOTE — Telephone Encounter (Signed)
Per Dr Hinton Rao, patient rescheduled to 4/6 Labs 10:30 am - Follow Up 11:00 am.  Patient notified

## 2020-06-08 ENCOUNTER — Ambulatory Visit: Payer: Medicare Other | Admitting: Oncology

## 2020-06-08 DIAGNOSIS — I10 Essential (primary) hypertension: Secondary | ICD-10-CM | POA: Diagnosis not present

## 2020-06-08 DIAGNOSIS — E7800 Pure hypercholesterolemia, unspecified: Secondary | ICD-10-CM | POA: Diagnosis not present

## 2020-06-09 DIAGNOSIS — C3491 Malignant neoplasm of unspecified part of right bronchus or lung: Secondary | ICD-10-CM | POA: Diagnosis not present

## 2020-06-09 DIAGNOSIS — I2699 Other pulmonary embolism without acute cor pulmonale: Secondary | ICD-10-CM | POA: Diagnosis not present

## 2020-06-09 DIAGNOSIS — M1990 Unspecified osteoarthritis, unspecified site: Secondary | ICD-10-CM | POA: Diagnosis not present

## 2020-06-09 DIAGNOSIS — M810 Age-related osteoporosis without current pathological fracture: Secondary | ICD-10-CM | POA: Diagnosis not present

## 2020-06-09 DIAGNOSIS — J90 Pleural effusion, not elsewhere classified: Secondary | ICD-10-CM | POA: Diagnosis not present

## 2020-06-09 DIAGNOSIS — Z853 Personal history of malignant neoplasm of breast: Secondary | ICD-10-CM | POA: Diagnosis not present

## 2020-06-09 DIAGNOSIS — C782 Secondary malignant neoplasm of pleura: Secondary | ICD-10-CM | POA: Diagnosis not present

## 2020-06-09 DIAGNOSIS — R590 Localized enlarged lymph nodes: Secondary | ICD-10-CM | POA: Diagnosis not present

## 2020-06-12 NOTE — Progress Notes (Signed)
[facility] [place]  [address] [place],  [place]  [address] [phone]  Clinic Day:  06/14/2020  Referring physician: Townsend Roger, MD   This document serves as a record of services personally performed by Hosie Poisson, MD. It was created on their behalf by [facility], a trained medical scribe. The creation of this record is based on the scribe's personal observations and the provider's statements to them.  CHIEF COMPLAINT:  CC: Stage IVA non-small cell lung cancer and history of stage IA hormone receptor positive left breast cancer  Current Treatment:  Supportive care   HISTORY OF PRESENT ILLNESS:  Marilyn Richard is a 87 y.o. female with stage IA (T1b N0 M0) hormone receptor positive left breast cancer diagnosed in April 2015.  She was treated with lumpectomy.  Pathology revealed a 0.8 cm, grade 1, invasive carcinoma with both ductal and lobular features and 2 negative sentinel nodes.  Estrogen receptors were positive, progesterone receptors negative and her 2 Neu negative.  She was placed on raloxifene in May 2015.  She has osteopenia, which was stable on bone density scan in January 2016.  We previously recommended testing for hereditary breast and ovarian cancer and at her last visit she agreed to testing.  She underwent testing with [facility] myRisk Hereditary Cancer Panel test which did not reveal any clinically significant mutation or variants of uncertain significance. She had a right knee replacement in April 2017 and states she still has some problems with pain of the right knee, for which Tylenol is effective.  Bone density scan in August 2018 revealed osteopenia, which was just mildly worse by 3%.  She had a persistent respiratory infection and chest X-ray in May revealed possible pulmonary nodule.  She then had a CT chest, which revealed multiple lung lesions and enlarged lymph nodes.  PET scan in June revealed a hypermetabolic  right middle lobe mass with multiple hypermetabolic right upper lobe pulmonary nodules consistent with bronchogenic carcinoma.  There was also hypermetabolic mediastinal lymph nodes in the subcarinal region with multiple lesions of the right pleural space and a hypermetabolic lymph node adjacent to the GE junction.  There was no other metastatic disease in the abdomen.  There was a single hypermetabolic focus in the S1 vertebral body.  CT guided biopsy of the lung was done in June.  Pathology revealed non-small cell carcinoma consistent with primary lung adenocarcinoma.  She therefore has a stage IVA (T3 N3 M1c) non-small cell lung cancer.  PDL1 was positive.  Foundation 1 testing revealed positive EGFR mutation, [id number].  Due to the EGFR mutation, we recommended palliative oral chemotherapy with osimertinib.  She had a repeat CT chest in July, which revealed significant worsening, so this is a more aggressive lung cancer than we originally thought.  The right middle lobe mass had increased from 3 cm to 4.9 cm in less than 2 months time and she had increased pulmonary and pleural based metastases as well as a new moderate sized right pleural effusion and progressive thoracic adenopathy.  She was declining rapidly.  Also, the CEA had nearly doubled in a short time up to 279 in July.  Due to her advanced age, we started osimertinib 40 mg once daily on July 20th, which is 50% of the standard dose.  Since starting osimertinib, she had improvement in her breathing and right chest pain.  She tolerated osimertinib 40 mg daily well, so it was increased to twice daily on August  3rd.  She has had intermittent diarrhea and hyponatremia during therapy. She also had mild leukopenia and anemia.  She was seen on August 26th due to persistent diarrhea despite increasing Lomotil to 6 tablets daily. Stool was positive for for Campylobacter.  The patient was placed on azithromycin 500 mg daily for 5 days.  Due to dehydration, Dr. Nona Dell changed her losartan/hydrochlorothiazide to losartan alone.  We resumed osimertinib 40 mg daily in September.  The CEA had decreased to 77 by that time.    When she was seen on October 28th, 2020, she had worsening rash especially of the face and neck.  She was placed on minocycline 100 mg twice daily, as well as clindamycin gel to use topically 3 times daily as needed for her rash which is associated with osimertinib.  The CEA had decreased to 35. CT chest, abdomen and pelvis in December of 2020 revealed marked improvement of the prior right middle lobe mass now measuring 9 x 12 mm, previously 3.0 x 4.9 cm and just mild residual right pleural effusion.  There was no evidence of metastatic disease.  The CEA was down to 5.2 in February.  We asked her to start calcium 600 mg daily for osteopenia.  Annual bilateral mammogram from April 8th was clear.  CT chest, abdomen and pelvis from April 15th revealed the nodule of the medial segment right middle lobe to be stable at 1.3 x 0.9 cm.  CEA in April was 5.3, and mildly increased to 5.6 in May. She fractured her right hip back in June and underwent surgery.  Bone density scan from June 14th revealed osteoporosis of the right forearm with a T-score of -2.9.  The left femur neck measures -1.7, previously -1.6, and considered osteopenic.  Dual femur total mean measures -0.9, previously -0.5, and considered normal.  CT chest from September 7th revealed moderate burden of bilateral upper and lower lobe pulmonary emboli.  There are no findings for significant right heart strain.  She has stable to slightly smaller right middle lobe pulmonary lesion, now measuring 12 x 8.5 mm, previously 13 x 9 mm.  She was contacted to begin anticoagulation ASAP, but preferred not to go with Lovenox injections.  She was placed on Xarelto 15 mg twice daily.  CEA was 15.5, down from 16.5 in August.  She completed 5 years of raloxifene so this was discontinued in September.   She was placed  on Prolia and had her 1st dose in October.  Her CEA increased to 37 in October and then 68.8 in November and 107 in December.  CT chest from December 14th revealed interval resolution of the peripheral left upper lobe ground-glass opacity identified as new on the previous exam.  There is no substantial change medial right middle lobe pulmonary lesion measuring 14 mm, previously 12 mm, and no new suspicious pulmonary nodule or mass.  CT chest, abdomen and pelvis from March 15th reveled significantly increased right pleural effusion, with equivocal enhancement along the right parietal pleural margin. There is also some increased nodularity along the right middle lobe measuring 2.2 x 1.4 cm, previously 1.9 x 1.2 cm.  She continues twice daily osimertinib since she already has a supply.  She still has a mild rash of the face from treatment.  Her latest CEA had increased from 298 to 464.    INTERVAL HISTORY:  Marilyn Richard is here for routine follow up and continued supportive care.  She states that she has been doing well  and denies any significant dyspnea or pain.  She has Hospice coming out intermittently, and has home oxygen if needed.  She continues the osimertinib twice daily as she still has a current prescription, but understands that this will be stopped when she runs out.  Blood counts and chemistries are unremarkable except for a sodium of 127, which has mildly worsened.  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  I advised that she include other beverages besides water.  Her  appetite is good, and her weight is stable since her last visit.  She denies fever, chills or other signs of infection.  She denies nausea, vomiting, bowel issues, or abdominal pain.  She denies sore throat, cough, dyspnea, or chest pain.  REVIEW OF SYSTEMS:  Review of Systems  Constitutional: Negative.  Negative for appetite change, chills, fatigue, fever and unexpected weight change.  HENT:  Negative.   Eyes: Negative.   Respiratory: Positive for  shortness of breath (very mild). Negative for chest tightness, cough, hemoptysis and wheezing.   Cardiovascular: Negative.  Negative for chest pain, leg swelling and palpitations.  Gastrointestinal: Negative.  Negative for abdominal distention, abdominal pain, blood in stool, constipation, diarrhea, nausea and vomiting.  Endocrine: Negative.   Genitourinary: Negative.  Negative for difficulty urinating, dysuria, frequency and hematuria.   Musculoskeletal: Negative.  Negative for arthralgias, back pain, flank pain, gait problem and myalgias.  Skin: Negative.   Neurological: Negative.  Negative for dizziness, extremity weakness, gait problem, headaches, light-headedness, numbness, seizures and speech difficulty.  Hematological: Negative.   Psychiatric/Behavioral: Negative.  Negative for depression and sleep disturbance. The patient is not nervous/anxious.      VITALS:  Blood pressure (!) 152/60, pulse 75, temperature 98.3 F (36.8 C), temperature source Oral, resp. rate 18, height 5' (1.524 m), weight 124 lb 11.2 oz (56.6 kg), SpO2 99 %.  Wt Readings from Last 3 Encounters:  06/14/20 124 lb 11.2 oz (56.6 kg)  05/26/20 125 lb (56.7 kg)  04/27/20 124 lb 14.4 oz (56.7 kg)    Body mass index is 24.35 kg/m.  Performance status (ECOG): 1 - Symptomatic but completely ambulatory  PHYSICAL EXAM:  Physical Exam Constitutional:      General: She is not in acute distress.    Appearance: Normal appearance. She is normal weight.  HENT:     Head: Normocephalic and atraumatic.  Eyes:     General: No scleral icterus.    Extraocular Movements: Extraocular movements intact.     Conjunctiva/sclera: Conjunctivae normal.     Pupils: Pupils are equal, round, and reactive to light.  Cardiovascular:     Rate and Rhythm: Normal rate and regular rhythm.     Pulses: Normal pulses.     Heart sounds: Normal heart sounds. No murmur heard. No friction rub. No gallop.   Pulmonary:     Effort: Pulmonary  effort is normal. No respiratory distress.     Breath sounds: Decreased breath sounds (very right base with a few crackles above that) present.  Abdominal:     General: Bowel sounds are normal. There is no distension.     Palpations: Abdomen is soft. There is no hepatomegaly, splenomegaly or mass.     Tenderness: There is no abdominal tenderness.  Musculoskeletal:        General: Normal range of motion.     Cervical back: Normal range of motion and neck supple.     Right lower leg: No edema.     Left lower leg: No edema.  Lymphadenopathy:  Cervical: No cervical adenopathy.  Skin:    General: Skin is warm and dry.  Neurological:     General: No focal deficit present.     Mental Status: She is alert and oriented to person, place, and time. Mental status is at baseline.  Psychiatric:        Mood and Affect: Mood normal.        Behavior: Behavior normal.        Thought Content: Thought content normal.        Judgment: Judgment normal.     LABS:   CBC Latest Ref Rng & Units 05/23/2020 04/25/2020 03/29/2020  WBC - 4.5 7.2 4.9  Hemoglobin 12.0 - 16.0 11.9(A) 11.8(A) 12.3  Hematocrit 36 - 46 36 35(A) 37  Platelets 150 - 399 152 140(A) 164   CMP Latest Ref Rng & Units 05/23/2020 04/25/2020 03/29/2020  Glucose 70 - 99 mg/dL - - -  BUN 4 - _0 23(A)  Creatinine 0.5 - 1.1 0.8 0.7 1.1  Sodium 137 - 147 128(A) 129(A) 133(A)  Potassium 3.4 - 5.3 4.6 4.3 4.3  Chloride 99 - 108 97(A) 97(A) 102  CO2 13 - 22 25(A) 24(A) 25(A)  Calcium 8.7 - 10.7 8.8 8.8 9.2  Total Protein 6.5 - 8.1 g/dL - - -  Total Bilirubin 0.3 - 1.2 mg/dL - - -  Alkaline Phos 25 - 125 38 36 30  AST 13 - 35 _1 ALT 7 - 35 _2 Lab Results  Component Value Date   CEA1 298.0 (H) 04/27/2020   /  CEA  Date Value Ref Range Status  04/27/2020 298.0 (H) 0.0 - 4.7 ng/mL Final    Comment:    (NOTE)                             Nonsmokers          <3.9                             Smokers              <5.6 [employer] Electrochemiluminescence Immunoassay (ECLIA) Values obtained with different assay methods or kits cannot be used interchangeably.  Results cannot be interpreted as absolute evidence of the presence or absence of malignant disease. Performed At: [facility] [place], [place] [id number] Rush Farmer MD TK:[id number]     STUDIES:   No current studies  Allergies:  Allergies  Allergen Reactions  . Wasp Venom Anaphylaxis  . Keflex [Cephalexin] Rash  . Levofloxacin In D5w Other (See Comments)    Caused issues with muscles in her arms.  Caused issues with muscles in her arms.   Tiajuana Amass [Loracarbef] Hives    Current Medications: Current Outpatient Medications  Medication Sig Dispense Refill  . acetaminophen (TYLENOL 8 HOUR) 650 MG CR tablet Take 500 mg by mouth every 8 (eight) hours as needed for pain. 2-547m twice a day    . ARTIFICIAL TEAR OP Apply to eye.    . Ascorbic Acid (VITAMIN C WITH ROSE HIPS) 500 MG tablet Take 500 mg by mouth daily.    . citalopram (CELEXA) 10 MG tablet Take 10 mg by mouth daily.    . clindamycin (CLINDAGEL) 1 % gel Apply topically 3 (three) times daily.    . diclofenac sodium (VOLTAREN)  1 % GEL 3 grams tid prn 3 Tube 3  . fenofibrate 160 MG tablet     . hydrALAZINE (APRESOLINE) 50 MG tablet Take 25 mg by mouth in the morning and at bedtime. Takes 1/2 tablet in am and 1/2 tablet in the afternoon for BP    . levothyroxine (SYNTHROID) 50 MCG tablet Take 50 mcg by mouth daily.    Marland Kitchen losartan (COZAAR) 100 MG tablet Take 100 mg by mouth daily.    . meclizine (ANTIVERT) 25 MG tablet Take by mouth as needed.     . minocycline (MINOCIN) 100 MG capsule Take 100 mg by mouth daily as needed.    . Multiple Vitamins-Minerals (MULTIVITAMIN WITH MINERALS) tablet Take 1 tablet by mouth daily.    Marland Kitchen osimertinib mesylate (TAGRISSO) 40 MG tablet Take 40 mg by mouth in the morning and at bedtime.    . Vitamin D,  Cholecalciferol, 1000 units CAPS Take 2,000 Units by mouth daily.     . vitamin E 400 UNIT capsule Take 400 Units by mouth daily.     No current facility-administered medications for this visit.     ASSESSMENT & PLAN:   Assessment:   1. History of stage IA hormone receptor positive left breast cancer April 2015 treated with lumpectomy.  She was placed on raloxifene was on that for over 5 years.  She has discontinued this oral therapy.  2. Osteoporosis.  Her last bone density from June 2021 revealed worsening and she is now considered osteoporotic.  We placed her on Prolia, but that has been stopped since she is now on Hospice.  3. Stage IVA non-small cell lung cancer, which is most consistent with adenocarcinoma.  EGFR and PDL1 were positive.  She is on palliative oral chemotherapy with osimertinib 40 mg twice daily.  The CEA had decreased steadily, but has now increased dramatically, last measuring 484.0.  Most recent CT imaging in March reveled significantly increased right pleural effusion, with equivocal enhancement along the right parietal pleural margin. There is also some increased nodularity of the right middle lobe measuring 2.2 x 1.4 cm, previously 1.9 x 1.2 cm.  She will finish out her current prescription for osimertinib, but we will discontinue after that.  We have referred her to Hospice, but she continues to remain stable.  4. Hyponatremia, mildly worse.  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  5. Dehydration, improved.    6. Chronic low back and hip pain, which in part may be secondary to the S1 metastasis, which is stable and controlled with Voltaren gel and Tylenol.    7. Rash felt to be secondary to osimertinib.  She has minimal rash today while on minocycline and so she can stay on 1 pill daily.  8. Hip fracture in June 2021 requiring surgery.  She is getting around well with the assistance of a walker.    9. Bilateral upper and lower lobe pulmonary emboli, without  significant right heart strain, September 2021.  She was placed on Xarelto and she completed 6 months so it has been stopped.    10.  Worsening right pleural effusion on CT, but this remains stable and mild on physical exam.  I explained she might need thoracentesis and/or PleurX catheter in the future.  I explained these procedures to her and we will only do so if her symptoms progress. Hospice has provided oxygen to use if needed.  Plan: We will continue with supportive care.  The patient continues twice daily osimertinib as she  still has a current prescription, but will discontinue after that.  Currently, she is fairly asymptomatic other than intermittent mild shortness of breath.  We will see her back on a monthly basis with CBC and CMP for repeat examination.  We will no longer obtain CEA as this will not change our plan of care.  She does have a DNR in place.  Patient and her daughter-in-law all verbalized understanding of and agreement to the plan.  She knows to call the office should any new questions or concerns arise.   I provided 30 minutes of face-to-face time during this this encounter and > 50% was spent counseling as documented under my assessment and plan.    Derwood Kaplan, MD [facility] AT [facility] [address] [place] [address] Dept: [phone] Dept Fax: [phone]   I, Rita Ohara, am acting as scribe for Derwood Kaplan, MD  I have reviewed this report as typed by the medical scribe, and it is complete and accurate.

## 2020-06-13 DIAGNOSIS — R590 Localized enlarged lymph nodes: Secondary | ICD-10-CM | POA: Diagnosis not present

## 2020-06-13 DIAGNOSIS — Z853 Personal history of malignant neoplasm of breast: Secondary | ICD-10-CM | POA: Diagnosis not present

## 2020-06-13 DIAGNOSIS — C3491 Malignant neoplasm of unspecified part of right bronchus or lung: Secondary | ICD-10-CM | POA: Diagnosis not present

## 2020-06-13 DIAGNOSIS — C782 Secondary malignant neoplasm of pleura: Secondary | ICD-10-CM | POA: Diagnosis not present

## 2020-06-13 DIAGNOSIS — I2699 Other pulmonary embolism without acute cor pulmonale: Secondary | ICD-10-CM | POA: Diagnosis not present

## 2020-06-13 DIAGNOSIS — J90 Pleural effusion, not elsewhere classified: Secondary | ICD-10-CM | POA: Diagnosis not present

## 2020-06-14 ENCOUNTER — Other Ambulatory Visit: Payer: Self-pay | Admitting: Oncology

## 2020-06-14 ENCOUNTER — Encounter: Payer: Self-pay | Admitting: Oncology

## 2020-06-14 ENCOUNTER — Telehealth: Payer: Self-pay | Admitting: Oncology

## 2020-06-14 ENCOUNTER — Other Ambulatory Visit: Payer: Self-pay | Admitting: Hematology and Oncology

## 2020-06-14 ENCOUNTER — Inpatient Hospital Stay (INDEPENDENT_AMBULATORY_CARE_PROVIDER_SITE_OTHER): Admitting: Oncology

## 2020-06-14 ENCOUNTER — Inpatient Hospital Stay: Attending: Oncology

## 2020-06-14 ENCOUNTER — Other Ambulatory Visit: Payer: Self-pay

## 2020-06-14 VITALS — BP 152/60 | HR 75 | Temp 98.3°F | Resp 18 | Ht 60.0 in | Wt 124.7 lb

## 2020-06-14 DIAGNOSIS — C3491 Malignant neoplasm of unspecified part of right bronchus or lung: Secondary | ICD-10-CM

## 2020-06-14 DIAGNOSIS — Z853 Personal history of malignant neoplasm of breast: Secondary | ICD-10-CM | POA: Diagnosis not present

## 2020-06-14 DIAGNOSIS — E871 Hypo-osmolality and hyponatremia: Secondary | ICD-10-CM | POA: Insufficient documentation

## 2020-06-14 DIAGNOSIS — Z66 Do not resuscitate: Secondary | ICD-10-CM | POA: Insufficient documentation

## 2020-06-14 DIAGNOSIS — D649 Anemia, unspecified: Secondary | ICD-10-CM | POA: Diagnosis not present

## 2020-06-14 DIAGNOSIS — M818 Other osteoporosis without current pathological fracture: Secondary | ICD-10-CM | POA: Diagnosis not present

## 2020-06-14 DIAGNOSIS — D72819 Decreased white blood cell count, unspecified: Secondary | ICD-10-CM | POA: Insufficient documentation

## 2020-06-14 DIAGNOSIS — Z79899 Other long term (current) drug therapy: Secondary | ICD-10-CM | POA: Insufficient documentation

## 2020-06-14 DIAGNOSIS — E860 Dehydration: Secondary | ICD-10-CM | POA: Insufficient documentation

## 2020-06-14 LAB — CBC
MCV: 90 (ref 81–99)
RBC: 4.14 (ref 3.87–5.11)

## 2020-06-14 LAB — CBC AND DIFFERENTIAL
HCT: 37 (ref 36–46)
Hemoglobin: 12 (ref 12.0–16.0)
Neutrophils Absolute: 2.56
Platelets: 170 (ref 150–399)
WBC: 4.2

## 2020-06-14 LAB — HEPATIC FUNCTION PANEL
ALT: 22 (ref 7–35)
AST: 38 — AB (ref 13–35)
Alkaline Phosphatase: 40 (ref 25–125)
Bilirubin, Total: 0.5

## 2020-06-14 LAB — BASIC METABOLIC PANEL
BUN: 19 (ref 4–21)
CO2: 25 — AB (ref 13–22)
Chloride: 97 — AB (ref 99–108)
Creatinine: 0.7 (ref 0.5–1.1)
Glucose: 82
Potassium: 4.6 (ref 3.4–5.3)
Sodium: 127 — AB (ref 137–147)

## 2020-06-14 LAB — COMPREHENSIVE METABOLIC PANEL
Albumin: 4.2 (ref 3.5–5.0)
Calcium: 8.5 — AB (ref 8.7–10.7)

## 2020-06-14 NOTE — Telephone Encounter (Signed)
Per 4/6 los next appt scheduled and given to patient

## 2020-06-15 LAB — CEA: CEA: 717 ng/mL — ABNORMAL HIGH (ref 0.0–4.7)

## 2020-06-16 ENCOUNTER — Inpatient Hospital Stay

## 2020-06-16 ENCOUNTER — Inpatient Hospital Stay: Admitting: Oncology

## 2020-06-20 DIAGNOSIS — Z853 Personal history of malignant neoplasm of breast: Secondary | ICD-10-CM | POA: Diagnosis not present

## 2020-06-20 DIAGNOSIS — J90 Pleural effusion, not elsewhere classified: Secondary | ICD-10-CM | POA: Diagnosis not present

## 2020-06-20 DIAGNOSIS — C782 Secondary malignant neoplasm of pleura: Secondary | ICD-10-CM | POA: Diagnosis not present

## 2020-06-20 DIAGNOSIS — R590 Localized enlarged lymph nodes: Secondary | ICD-10-CM | POA: Diagnosis not present

## 2020-06-20 DIAGNOSIS — C3491 Malignant neoplasm of unspecified part of right bronchus or lung: Secondary | ICD-10-CM | POA: Diagnosis not present

## 2020-06-20 DIAGNOSIS — I2699 Other pulmonary embolism without acute cor pulmonale: Secondary | ICD-10-CM | POA: Diagnosis not present

## 2020-06-21 DIAGNOSIS — Z853 Personal history of malignant neoplasm of breast: Secondary | ICD-10-CM | POA: Diagnosis not present

## 2020-06-21 DIAGNOSIS — I2699 Other pulmonary embolism without acute cor pulmonale: Secondary | ICD-10-CM | POA: Diagnosis not present

## 2020-06-21 DIAGNOSIS — J90 Pleural effusion, not elsewhere classified: Secondary | ICD-10-CM | POA: Diagnosis not present

## 2020-06-21 DIAGNOSIS — C782 Secondary malignant neoplasm of pleura: Secondary | ICD-10-CM | POA: Diagnosis not present

## 2020-06-21 DIAGNOSIS — C3491 Malignant neoplasm of unspecified part of right bronchus or lung: Secondary | ICD-10-CM | POA: Diagnosis not present

## 2020-06-21 DIAGNOSIS — R590 Localized enlarged lymph nodes: Secondary | ICD-10-CM | POA: Diagnosis not present

## 2020-06-22 ENCOUNTER — Ambulatory Visit: Payer: Medicare Other

## 2020-06-27 DIAGNOSIS — I2699 Other pulmonary embolism without acute cor pulmonale: Secondary | ICD-10-CM | POA: Diagnosis not present

## 2020-06-27 DIAGNOSIS — Z853 Personal history of malignant neoplasm of breast: Secondary | ICD-10-CM | POA: Diagnosis not present

## 2020-06-27 DIAGNOSIS — J90 Pleural effusion, not elsewhere classified: Secondary | ICD-10-CM | POA: Diagnosis not present

## 2020-06-27 DIAGNOSIS — R590 Localized enlarged lymph nodes: Secondary | ICD-10-CM | POA: Diagnosis not present

## 2020-06-27 DIAGNOSIS — C3491 Malignant neoplasm of unspecified part of right bronchus or lung: Secondary | ICD-10-CM | POA: Diagnosis not present

## 2020-06-27 DIAGNOSIS — C782 Secondary malignant neoplasm of pleura: Secondary | ICD-10-CM | POA: Diagnosis not present

## 2020-06-28 DIAGNOSIS — C782 Secondary malignant neoplasm of pleura: Secondary | ICD-10-CM | POA: Diagnosis not present

## 2020-06-28 DIAGNOSIS — Z853 Personal history of malignant neoplasm of breast: Secondary | ICD-10-CM | POA: Diagnosis not present

## 2020-06-28 DIAGNOSIS — R590 Localized enlarged lymph nodes: Secondary | ICD-10-CM | POA: Diagnosis not present

## 2020-06-28 DIAGNOSIS — I2699 Other pulmonary embolism without acute cor pulmonale: Secondary | ICD-10-CM | POA: Diagnosis not present

## 2020-06-28 DIAGNOSIS — J90 Pleural effusion, not elsewhere classified: Secondary | ICD-10-CM | POA: Diagnosis not present

## 2020-06-28 DIAGNOSIS — C3491 Malignant neoplasm of unspecified part of right bronchus or lung: Secondary | ICD-10-CM | POA: Diagnosis not present

## 2020-07-04 DIAGNOSIS — J90 Pleural effusion, not elsewhere classified: Secondary | ICD-10-CM | POA: Diagnosis not present

## 2020-07-04 DIAGNOSIS — C782 Secondary malignant neoplasm of pleura: Secondary | ICD-10-CM | POA: Diagnosis not present

## 2020-07-04 DIAGNOSIS — R590 Localized enlarged lymph nodes: Secondary | ICD-10-CM | POA: Diagnosis not present

## 2020-07-04 DIAGNOSIS — C3491 Malignant neoplasm of unspecified part of right bronchus or lung: Secondary | ICD-10-CM | POA: Diagnosis not present

## 2020-07-04 DIAGNOSIS — Z853 Personal history of malignant neoplasm of breast: Secondary | ICD-10-CM | POA: Diagnosis not present

## 2020-07-04 DIAGNOSIS — I2699 Other pulmonary embolism without acute cor pulmonale: Secondary | ICD-10-CM | POA: Diagnosis not present

## 2020-07-05 DIAGNOSIS — Z853 Personal history of malignant neoplasm of breast: Secondary | ICD-10-CM | POA: Diagnosis not present

## 2020-07-05 DIAGNOSIS — C782 Secondary malignant neoplasm of pleura: Secondary | ICD-10-CM | POA: Diagnosis not present

## 2020-07-05 DIAGNOSIS — J90 Pleural effusion, not elsewhere classified: Secondary | ICD-10-CM | POA: Diagnosis not present

## 2020-07-05 DIAGNOSIS — C3491 Malignant neoplasm of unspecified part of right bronchus or lung: Secondary | ICD-10-CM | POA: Diagnosis not present

## 2020-07-05 DIAGNOSIS — R590 Localized enlarged lymph nodes: Secondary | ICD-10-CM | POA: Diagnosis not present

## 2020-07-05 DIAGNOSIS — I2699 Other pulmonary embolism without acute cor pulmonale: Secondary | ICD-10-CM | POA: Diagnosis not present

## 2020-07-07 ENCOUNTER — Other Ambulatory Visit: Payer: Self-pay | Admitting: *Deleted

## 2020-07-07 NOTE — Patient Outreach (Signed)
[facility]) Care Management  07/07/2020  Marilyn Richard [birth year] [id number]   Case Closure  Spoke with pt today who indicated she is with [place]. Pt received home visits several times weekly with hospice for her ongoing needs.  Pt no longer meets criteria for [facility] services and will be discharged. Primary provider will be notified of this case closures.  Raina Mina, RN Care Management Coordinator [place] Office [phone]

## 2020-07-08 DIAGNOSIS — R590 Localized enlarged lymph nodes: Secondary | ICD-10-CM | POA: Diagnosis not present

## 2020-07-08 DIAGNOSIS — I2699 Other pulmonary embolism without acute cor pulmonale: Secondary | ICD-10-CM | POA: Diagnosis not present

## 2020-07-08 DIAGNOSIS — C3491 Malignant neoplasm of unspecified part of right bronchus or lung: Secondary | ICD-10-CM | POA: Diagnosis not present

## 2020-07-08 DIAGNOSIS — Z853 Personal history of malignant neoplasm of breast: Secondary | ICD-10-CM | POA: Diagnosis not present

## 2020-07-08 DIAGNOSIS — J90 Pleural effusion, not elsewhere classified: Secondary | ICD-10-CM | POA: Diagnosis not present

## 2020-07-08 DIAGNOSIS — C782 Secondary malignant neoplasm of pleura: Secondary | ICD-10-CM | POA: Diagnosis not present

## 2020-07-09 DIAGNOSIS — J90 Pleural effusion, not elsewhere classified: Secondary | ICD-10-CM | POA: Diagnosis not present

## 2020-07-09 DIAGNOSIS — M1990 Unspecified osteoarthritis, unspecified site: Secondary | ICD-10-CM | POA: Diagnosis not present

## 2020-07-09 DIAGNOSIS — M810 Age-related osteoporosis without current pathological fracture: Secondary | ICD-10-CM | POA: Diagnosis not present

## 2020-07-09 DIAGNOSIS — Z853 Personal history of malignant neoplasm of breast: Secondary | ICD-10-CM | POA: Diagnosis not present

## 2020-07-09 DIAGNOSIS — R590 Localized enlarged lymph nodes: Secondary | ICD-10-CM | POA: Diagnosis not present

## 2020-07-09 DIAGNOSIS — I2699 Other pulmonary embolism without acute cor pulmonale: Secondary | ICD-10-CM | POA: Diagnosis not present

## 2020-07-09 DIAGNOSIS — C782 Secondary malignant neoplasm of pleura: Secondary | ICD-10-CM | POA: Diagnosis not present

## 2020-07-09 DIAGNOSIS — C3491 Malignant neoplasm of unspecified part of right bronchus or lung: Secondary | ICD-10-CM | POA: Diagnosis not present

## 2020-07-10 ENCOUNTER — Other Ambulatory Visit: Payer: Self-pay | Admitting: Hematology and Oncology

## 2020-07-10 DIAGNOSIS — J90 Pleural effusion, not elsewhere classified: Secondary | ICD-10-CM | POA: Diagnosis not present

## 2020-07-10 DIAGNOSIS — Z853 Personal history of malignant neoplasm of breast: Secondary | ICD-10-CM | POA: Diagnosis not present

## 2020-07-10 DIAGNOSIS — C782 Secondary malignant neoplasm of pleura: Secondary | ICD-10-CM | POA: Diagnosis not present

## 2020-07-10 DIAGNOSIS — C3491 Malignant neoplasm of unspecified part of right bronchus or lung: Secondary | ICD-10-CM | POA: Diagnosis not present

## 2020-07-10 DIAGNOSIS — I2699 Other pulmonary embolism without acute cor pulmonale: Secondary | ICD-10-CM | POA: Diagnosis not present

## 2020-07-10 DIAGNOSIS — R590 Localized enlarged lymph nodes: Secondary | ICD-10-CM | POA: Diagnosis not present

## 2020-07-10 MED ORDER — ALBUTEROL SULFATE (2.5 MG/3ML) 0.083% IN NEBU: 2.5000 mg | INHALATION_SOLUTION | Freq: Four times a day (QID) | RESPIRATORY_TRACT | 12 refills | Status: DC | PRN

## 2020-07-10 MED ORDER — MORPHINE SULFATE (CONCENTRATE) 20 MG/ML PO SOLN: 5.0000 mg | ORAL | 0 refills | Status: AC | PRN

## 2020-07-11 ENCOUNTER — Telehealth: Payer: Self-pay | Admitting: Hematology and Oncology

## 2020-07-11 DIAGNOSIS — R590 Localized enlarged lymph nodes: Secondary | ICD-10-CM | POA: Diagnosis not present

## 2020-07-11 DIAGNOSIS — C782 Secondary malignant neoplasm of pleura: Secondary | ICD-10-CM | POA: Diagnosis not present

## 2020-07-11 DIAGNOSIS — I2699 Other pulmonary embolism without acute cor pulmonale: Secondary | ICD-10-CM | POA: Diagnosis not present

## 2020-07-11 DIAGNOSIS — Z853 Personal history of malignant neoplasm of breast: Secondary | ICD-10-CM | POA: Diagnosis not present

## 2020-07-11 DIAGNOSIS — C3491 Malignant neoplasm of unspecified part of right bronchus or lung: Secondary | ICD-10-CM | POA: Diagnosis not present

## 2020-07-11 DIAGNOSIS — J90 Pleural effusion, not elsewhere classified: Secondary | ICD-10-CM | POA: Diagnosis not present

## 2020-07-11 NOTE — Telephone Encounter (Signed)
Per daughter, patient is unable to come to anymore Appt's.  She is under [facility]

## 2020-07-14 ENCOUNTER — Inpatient Hospital Stay: Admitting: Hematology and Oncology

## 2020-07-14 ENCOUNTER — Inpatient Hospital Stay

## 2020-07-14 DIAGNOSIS — C3491 Malignant neoplasm of unspecified part of right bronchus or lung: Secondary | ICD-10-CM | POA: Diagnosis not present

## 2020-07-14 DIAGNOSIS — R590 Localized enlarged lymph nodes: Secondary | ICD-10-CM | POA: Diagnosis not present

## 2020-07-14 DIAGNOSIS — J90 Pleural effusion, not elsewhere classified: Secondary | ICD-10-CM | POA: Diagnosis not present

## 2020-07-14 DIAGNOSIS — I2699 Other pulmonary embolism without acute cor pulmonale: Secondary | ICD-10-CM | POA: Diagnosis not present

## 2020-07-14 DIAGNOSIS — Z853 Personal history of malignant neoplasm of breast: Secondary | ICD-10-CM | POA: Diagnosis not present

## 2020-07-14 DIAGNOSIS — C782 Secondary malignant neoplasm of pleura: Secondary | ICD-10-CM | POA: Diagnosis not present

## 2020-07-17 DIAGNOSIS — C782 Secondary malignant neoplasm of pleura: Secondary | ICD-10-CM | POA: Diagnosis not present

## 2020-07-17 DIAGNOSIS — Z853 Personal history of malignant neoplasm of breast: Secondary | ICD-10-CM | POA: Diagnosis not present

## 2020-07-17 DIAGNOSIS — C3491 Malignant neoplasm of unspecified part of right bronchus or lung: Secondary | ICD-10-CM | POA: Diagnosis not present

## 2020-07-17 DIAGNOSIS — I2699 Other pulmonary embolism without acute cor pulmonale: Secondary | ICD-10-CM | POA: Diagnosis not present

## 2020-07-17 DIAGNOSIS — J90 Pleural effusion, not elsewhere classified: Secondary | ICD-10-CM | POA: Diagnosis not present

## 2020-07-17 DIAGNOSIS — R590 Localized enlarged lymph nodes: Secondary | ICD-10-CM | POA: Diagnosis not present

## 2020-07-18 ENCOUNTER — Telehealth: Payer: Self-pay

## 2020-07-18 ENCOUNTER — Other Ambulatory Visit: Payer: Self-pay | Admitting: Hematology and Oncology

## 2020-07-18 DIAGNOSIS — R590 Localized enlarged lymph nodes: Secondary | ICD-10-CM | POA: Diagnosis not present

## 2020-07-18 DIAGNOSIS — Z853 Personal history of malignant neoplasm of breast: Secondary | ICD-10-CM | POA: Diagnosis not present

## 2020-07-18 DIAGNOSIS — C782 Secondary malignant neoplasm of pleura: Secondary | ICD-10-CM | POA: Diagnosis not present

## 2020-07-18 DIAGNOSIS — I2699 Other pulmonary embolism without acute cor pulmonale: Secondary | ICD-10-CM | POA: Diagnosis not present

## 2020-07-18 DIAGNOSIS — C3491 Malignant neoplasm of unspecified part of right bronchus or lung: Secondary | ICD-10-CM | POA: Diagnosis not present

## 2020-07-18 DIAGNOSIS — J90 Pleural effusion, not elsewhere classified: Secondary | ICD-10-CM | POA: Diagnosis not present

## 2020-07-18 MED ORDER — HYOSCYAMINE SULFATE 0.125 MG PO TBDP: 0.1250 mg | ORAL_TABLET | ORAL | 0 refills | Status: AC | PRN

## 2020-07-18 MED ORDER — BENZONATATE 100 MG PO CAPS: 100.0000 mg | ORAL_CAPSULE | Freq: Three times a day (TID) | ORAL | 0 refills | Status: AC | PRN

## 2020-07-18 NOTE — Telephone Encounter (Signed)
-----   Message from Marvia Pickles, PA-C sent at 07/18/2020 12:12 PM EDT ----- Regarding: FW: Hospice requests Contact: [phone] Please let them know I sent in hyoscyamine 0.125mg  ODT every 4hrs as needed for secretions, as well as benzonatate 100mg  tid prn cough. Thanks ----- Message ----- From: Dairl Ponder, RN Sent: 07/18/2020  10:29 AM EDT To: Marvia Pickles, PA-C Subject: Hospice requests                               Received call from Hospice case manager, Wyatt Haste. Pt is getting weaker, and has increased secretions- unable to get them up. Has congested cough, but she doesn't hear congestion in lungs.Increased SOB.   Asking for hycosamine tablets for secretions and whatever you think for the cough? Will need to be sent to pharmacy.

## 2020-07-18 NOTE — Telephone Encounter (Signed)
Called Marilyn Richard to let her know that the medications were sent to [facility]. She also informed me that patient is declining and she is ready to when the time is ready. She will call and let us know of any changes if any.

## 2020-07-19 ENCOUNTER — Other Ambulatory Visit: Payer: Self-pay

## 2020-07-19 DIAGNOSIS — C782 Secondary malignant neoplasm of pleura: Secondary | ICD-10-CM | POA: Diagnosis not present

## 2020-07-19 DIAGNOSIS — J90 Pleural effusion, not elsewhere classified: Secondary | ICD-10-CM | POA: Diagnosis not present

## 2020-07-19 DIAGNOSIS — C3491 Malignant neoplasm of unspecified part of right bronchus or lung: Secondary | ICD-10-CM | POA: Diagnosis not present

## 2020-07-19 DIAGNOSIS — R0602 Shortness of breath: Secondary | ICD-10-CM

## 2020-07-19 DIAGNOSIS — I2699 Other pulmonary embolism without acute cor pulmonale: Secondary | ICD-10-CM | POA: Diagnosis not present

## 2020-07-19 DIAGNOSIS — R590 Localized enlarged lymph nodes: Secondary | ICD-10-CM | POA: Diagnosis not present

## 2020-07-19 DIAGNOSIS — Z853 Personal history of malignant neoplasm of breast: Secondary | ICD-10-CM | POA: Diagnosis not present

## 2020-07-19 DIAGNOSIS — R062 Wheezing: Secondary | ICD-10-CM

## 2020-07-19 MED ORDER — ALBUTEROL SULFATE (2.5 MG/3ML) 0.083% IN NEBU: 2.5000 mg | INHALATION_SOLUTION | Freq: Four times a day (QID) | RESPIRATORY_TRACT | 12 refills | Status: AC | PRN

## 2020-07-20 DIAGNOSIS — R590 Localized enlarged lymph nodes: Secondary | ICD-10-CM | POA: Diagnosis not present

## 2020-07-20 DIAGNOSIS — J90 Pleural effusion, not elsewhere classified: Secondary | ICD-10-CM | POA: Diagnosis not present

## 2020-07-20 DIAGNOSIS — Z853 Personal history of malignant neoplasm of breast: Secondary | ICD-10-CM | POA: Diagnosis not present

## 2020-07-20 DIAGNOSIS — C782 Secondary malignant neoplasm of pleura: Secondary | ICD-10-CM | POA: Diagnosis not present

## 2020-07-20 DIAGNOSIS — I2699 Other pulmonary embolism without acute cor pulmonale: Secondary | ICD-10-CM | POA: Diagnosis not present

## 2020-07-20 DIAGNOSIS — C3491 Malignant neoplasm of unspecified part of right bronchus or lung: Secondary | ICD-10-CM | POA: Diagnosis not present

## 2020-07-21 DIAGNOSIS — C3491 Malignant neoplasm of unspecified part of right bronchus or lung: Secondary | ICD-10-CM | POA: Diagnosis not present

## 2020-07-21 DIAGNOSIS — I2699 Other pulmonary embolism without acute cor pulmonale: Secondary | ICD-10-CM | POA: Diagnosis not present

## 2020-07-21 DIAGNOSIS — C782 Secondary malignant neoplasm of pleura: Secondary | ICD-10-CM | POA: Diagnosis not present

## 2020-07-21 DIAGNOSIS — J90 Pleural effusion, not elsewhere classified: Secondary | ICD-10-CM | POA: Diagnosis not present

## 2020-07-21 DIAGNOSIS — R590 Localized enlarged lymph nodes: Secondary | ICD-10-CM | POA: Diagnosis not present

## 2020-07-21 DIAGNOSIS — Z853 Personal history of malignant neoplasm of breast: Secondary | ICD-10-CM | POA: Diagnosis not present

## 2020-07-24 DIAGNOSIS — J90 Pleural effusion, not elsewhere classified: Secondary | ICD-10-CM | POA: Diagnosis not present

## 2020-07-24 DIAGNOSIS — R590 Localized enlarged lymph nodes: Secondary | ICD-10-CM | POA: Diagnosis not present

## 2020-07-24 DIAGNOSIS — I2699 Other pulmonary embolism without acute cor pulmonale: Secondary | ICD-10-CM | POA: Diagnosis not present

## 2020-07-24 DIAGNOSIS — C782 Secondary malignant neoplasm of pleura: Secondary | ICD-10-CM | POA: Diagnosis not present

## 2020-07-24 DIAGNOSIS — C3491 Malignant neoplasm of unspecified part of right bronchus or lung: Secondary | ICD-10-CM | POA: Diagnosis not present

## 2020-07-24 DIAGNOSIS — Z853 Personal history of malignant neoplasm of breast: Secondary | ICD-10-CM | POA: Diagnosis not present

## 2020-07-25 DIAGNOSIS — R590 Localized enlarged lymph nodes: Secondary | ICD-10-CM | POA: Diagnosis not present

## 2020-07-25 DIAGNOSIS — J90 Pleural effusion, not elsewhere classified: Secondary | ICD-10-CM | POA: Diagnosis not present

## 2020-07-25 DIAGNOSIS — Z853 Personal history of malignant neoplasm of breast: Secondary | ICD-10-CM | POA: Diagnosis not present

## 2020-07-25 DIAGNOSIS — C3491 Malignant neoplasm of unspecified part of right bronchus or lung: Secondary | ICD-10-CM | POA: Diagnosis not present

## 2020-07-25 DIAGNOSIS — C782 Secondary malignant neoplasm of pleura: Secondary | ICD-10-CM | POA: Diagnosis not present

## 2020-07-25 DIAGNOSIS — I2699 Other pulmonary embolism without acute cor pulmonale: Secondary | ICD-10-CM | POA: Diagnosis not present

## 2020-07-26 DIAGNOSIS — C782 Secondary malignant neoplasm of pleura: Secondary | ICD-10-CM | POA: Diagnosis not present

## 2020-07-26 DIAGNOSIS — J90 Pleural effusion, not elsewhere classified: Secondary | ICD-10-CM | POA: Diagnosis not present

## 2020-07-26 DIAGNOSIS — I2699 Other pulmonary embolism without acute cor pulmonale: Secondary | ICD-10-CM | POA: Diagnosis not present

## 2020-07-26 DIAGNOSIS — C3491 Malignant neoplasm of unspecified part of right bronchus or lung: Secondary | ICD-10-CM | POA: Diagnosis not present

## 2020-07-26 DIAGNOSIS — R590 Localized enlarged lymph nodes: Secondary | ICD-10-CM | POA: Diagnosis not present

## 2020-07-26 DIAGNOSIS — Z853 Personal history of malignant neoplasm of breast: Secondary | ICD-10-CM | POA: Diagnosis not present

## 2020-07-27 DIAGNOSIS — Z853 Personal history of malignant neoplasm of breast: Secondary | ICD-10-CM | POA: Diagnosis not present

## 2020-07-27 DIAGNOSIS — C3491 Malignant neoplasm of unspecified part of right bronchus or lung: Secondary | ICD-10-CM | POA: Diagnosis not present

## 2020-07-27 DIAGNOSIS — C782 Secondary malignant neoplasm of pleura: Secondary | ICD-10-CM | POA: Diagnosis not present

## 2020-07-27 DIAGNOSIS — R590 Localized enlarged lymph nodes: Secondary | ICD-10-CM | POA: Diagnosis not present

## 2020-07-27 DIAGNOSIS — I2699 Other pulmonary embolism without acute cor pulmonale: Secondary | ICD-10-CM | POA: Diagnosis not present

## 2020-07-27 DIAGNOSIS — J90 Pleural effusion, not elsewhere classified: Secondary | ICD-10-CM | POA: Diagnosis not present

## 2020-07-28 DIAGNOSIS — I2699 Other pulmonary embolism without acute cor pulmonale: Secondary | ICD-10-CM | POA: Diagnosis not present

## 2020-07-28 DIAGNOSIS — Z853 Personal history of malignant neoplasm of breast: Secondary | ICD-10-CM | POA: Diagnosis not present

## 2020-07-28 DIAGNOSIS — C782 Secondary malignant neoplasm of pleura: Secondary | ICD-10-CM | POA: Diagnosis not present

## 2020-07-28 DIAGNOSIS — C3491 Malignant neoplasm of unspecified part of right bronchus or lung: Secondary | ICD-10-CM | POA: Diagnosis not present

## 2020-07-28 DIAGNOSIS — R590 Localized enlarged lymph nodes: Secondary | ICD-10-CM | POA: Diagnosis not present

## 2020-07-28 DIAGNOSIS — J90 Pleural effusion, not elsewhere classified: Secondary | ICD-10-CM | POA: Diagnosis not present

## 2020-07-31 ENCOUNTER — Telehealth: Payer: Self-pay

## 2020-07-31 DIAGNOSIS — I2699 Other pulmonary embolism without acute cor pulmonale: Secondary | ICD-10-CM | POA: Diagnosis not present

## 2020-07-31 DIAGNOSIS — R590 Localized enlarged lymph nodes: Secondary | ICD-10-CM | POA: Diagnosis not present

## 2020-07-31 DIAGNOSIS — C782 Secondary malignant neoplasm of pleura: Secondary | ICD-10-CM | POA: Diagnosis not present

## 2020-07-31 DIAGNOSIS — J90 Pleural effusion, not elsewhere classified: Secondary | ICD-10-CM | POA: Diagnosis not present

## 2020-07-31 DIAGNOSIS — C3491 Malignant neoplasm of unspecified part of right bronchus or lung: Secondary | ICD-10-CM | POA: Diagnosis not present

## 2020-07-31 DIAGNOSIS — Z853 Personal history of malignant neoplasm of breast: Secondary | ICD-10-CM | POA: Diagnosis not present

## 2020-07-31 NOTE — Telephone Encounter (Signed)
@  1240:  I notified [facility], of the below orders. He agreed to the orders. Carmen,RN, read back and verified them.        @1207 -  Received call from Matthew Folks, Hospice nurse. Over the weekend, Dr Rocky Link gave them some new orders for the pt's morphine and lorazepam. Carmen,RN, wanted to make sure the orders were ok with [place].   1. Morphine 0.5mg  q 4hr SL/PO routine  For terminal agitation, increased respiratory effort/breathlessness, pain 2. Lorazepam 0.5mg  q 4hr SL/PO PRN agitation/retlessness

## 2020-08-01 DIAGNOSIS — R590 Localized enlarged lymph nodes: Secondary | ICD-10-CM | POA: Diagnosis not present

## 2020-08-01 DIAGNOSIS — C3491 Malignant neoplasm of unspecified part of right bronchus or lung: Secondary | ICD-10-CM | POA: Diagnosis not present

## 2020-08-01 DIAGNOSIS — J90 Pleural effusion, not elsewhere classified: Secondary | ICD-10-CM | POA: Diagnosis not present

## 2020-08-01 DIAGNOSIS — Z853 Personal history of malignant neoplasm of breast: Secondary | ICD-10-CM | POA: Diagnosis not present

## 2020-08-01 DIAGNOSIS — C782 Secondary malignant neoplasm of pleura: Secondary | ICD-10-CM | POA: Diagnosis not present

## 2020-08-01 DIAGNOSIS — I2699 Other pulmonary embolism without acute cor pulmonale: Secondary | ICD-10-CM | POA: Diagnosis not present

## 2020-08-09 DEATH — deceased
